# Patient Record
Sex: Male | Born: 1960 | Race: White | Hispanic: No | Marital: Single | Smoking: Former smoker
Health system: Southern US, Community
[De-identification: ages and names within clinical notes are randomized; demographics above are authoritative.]

## PROBLEM LIST (undated history)

## (undated) DIAGNOSIS — E7800 Pure hypercholesterolemia, unspecified: Secondary | ICD-10-CM

## (undated) DIAGNOSIS — F419 Anxiety disorder, unspecified: Secondary | ICD-10-CM

## (undated) DIAGNOSIS — J189 Pneumonia, unspecified organism: Secondary | ICD-10-CM

## (undated) DIAGNOSIS — I10 Essential (primary) hypertension: Secondary | ICD-10-CM

## (undated) DIAGNOSIS — R7303 Prediabetes: Secondary | ICD-10-CM

## (undated) HISTORY — DX: Pneumonia, unspecified organism: J18.9

## (undated) HISTORY — PX: OTHER SURGICAL HISTORY: SHX169

## (undated) HISTORY — DX: Anxiety disorder, unspecified: F41.9

## (undated) HISTORY — DX: Pure hypercholesterolemia, unspecified: E78.00

## (undated) HISTORY — DX: Essential (primary) hypertension: I10

## (undated) HISTORY — DX: Prediabetes: R73.03

---

## 2005-01-17 ENCOUNTER — Emergency Department (HOSPITAL_COMMUNITY): Admission: EM | Admit: 2005-01-17 | Discharge: 2005-01-17 | Payer: Self-pay | Admitting: Orthopedic Surgery

## 2009-01-03 ENCOUNTER — Ambulatory Visit (HOSPITAL_COMMUNITY): Admission: RE | Admit: 2009-01-03 | Discharge: 2009-01-03 | Payer: Self-pay | Admitting: Family Medicine

## 2009-04-08 ENCOUNTER — Ambulatory Visit: Payer: Self-pay | Admitting: Family Medicine

## 2009-04-08 ENCOUNTER — Observation Stay (HOSPITAL_COMMUNITY): Admission: EM | Admit: 2009-04-08 | Discharge: 2009-04-09 | Payer: Self-pay | Admitting: Emergency Medicine

## 2010-05-02 ENCOUNTER — Encounter (INDEPENDENT_AMBULATORY_CARE_PROVIDER_SITE_OTHER): Payer: Self-pay | Admitting: Internal Medicine

## 2010-05-02 ENCOUNTER — Inpatient Hospital Stay (HOSPITAL_COMMUNITY): Admission: EM | Admit: 2010-05-02 | Discharge: 2010-05-03 | Payer: Self-pay | Admitting: Emergency Medicine

## 2010-05-02 ENCOUNTER — Ambulatory Visit: Payer: Self-pay | Admitting: Cardiovascular Disease

## 2010-05-05 ENCOUNTER — Emergency Department (HOSPITAL_COMMUNITY): Admission: EM | Admit: 2010-05-05 | Discharge: 2010-05-05 | Payer: Self-pay | Admitting: Emergency Medicine

## 2010-10-20 LAB — POCT I-STAT, CHEM 8
Sodium: 139 mEq/L (ref 135–145)
TCO2: 28 mmol/L (ref 0–100)

## 2010-10-20 LAB — TROPONIN I: Troponin I: 0.03 ng/mL (ref 0.00–0.06)

## 2010-10-20 LAB — DIFFERENTIAL
Basophils Relative: 1 % (ref 0–1)
Eosinophils Relative: 1 % (ref 0–5)
Lymphs Abs: 2.4 10*3/uL (ref 0.7–4.0)
Monocytes Absolute: 0.8 10*3/uL (ref 0.1–1.0)
Monocytes Relative: 9 % (ref 3–12)
Neutro Abs: 5.3 10*3/uL (ref 1.7–7.7)
Neutrophils Relative %: 62 % (ref 43–77)

## 2010-10-20 LAB — HEPATIC FUNCTION PANEL
Albumin: 3.9 g/dL (ref 3.5–5.2)
Alkaline Phosphatase: 63 U/L (ref 39–117)
Total Bilirubin: 0.4 mg/dL (ref 0.3–1.2)
Total Protein: 6.7 g/dL (ref 6.0–8.3)

## 2010-10-20 LAB — LIPID PANEL
HDL: 44 mg/dL (ref >39)
LDL Cholesterol: 125 mg/dL — ABNORMAL HIGH (ref 0–99)
Total CHOL/HDL Ratio: 4.3 RATIO
Triglycerides: 101 mg/dL (ref <150)
VLDL: 20 mg/dL (ref 0–40)

## 2010-10-20 LAB — CARDIAC PANEL(CRET KIN+CKTOT+MB+TROPI)
CK, MB: 1.3 ng/mL (ref 0.3–4.0)
CK, MB: 1.7 ng/mL (ref 0.3–4.0)
Total CK: 58 U/L (ref 7–232)
Total CK: 64 U/L (ref 7–232)
Troponin I: 0.01 ng/mL (ref 0.00–0.06)
Troponin I: 0.01 ng/mL (ref 0.00–0.06)

## 2010-10-20 LAB — CBC
HCT: 43.9 % (ref 39.0–52.0)
MCH: 32.8 pg (ref 26.0–34.0)
MCHC: 34.6 g/dL (ref 30.0–36.0)
RDW: 13.7 % (ref 11.5–15.5)

## 2010-10-20 LAB — D-DIMER, QUANTITATIVE

## 2010-10-20 LAB — POCT CARDIAC MARKERS
CKMB, poc: <1 ng/mL — ABNORMAL LOW (ref 1.0–8.0)
Myoglobin, poc: 31.6 ng/mL (ref 12–200)
Myoglobin, poc: 38.7 ng/mL (ref 12–200)
Troponin i, poc: <0.05 ng/mL (ref 0.00–0.09)
Troponin i, poc: <0.05 ng/mL (ref 0.00–0.09)

## 2010-10-20 LAB — BASIC METABOLIC PANEL
Creatinine, Ser: 0.85 mg/dL (ref 0.4–1.5)
GFR calc non Af Amer: >60 mL/min (ref >60)
Glucose, Bld: 96 mg/dL (ref 70–99)
Potassium: 4.3 mEq/L (ref 3.5–5.1)
Sodium: 137 mEq/L (ref 135–145)

## 2010-10-20 LAB — HEMOGLOBIN A1C: Hgb A1c MFr Bld: 6.1 % — ABNORMAL HIGH (ref <5.7)

## 2010-10-20 LAB — CK TOTAL AND CKMB (NOT AT ARMC)
CK, MB: 1.7 ng/mL (ref 0.3–4.0)
Relative Index: INVALID (ref 0.0–2.5)

## 2010-10-20 LAB — MRSA PCR SCREENING: MRSA by PCR: NEGATIVE

## 2010-10-20 LAB — LIPASE, BLOOD: Lipase: 41 U/L (ref 11–59)

## 2010-10-20 LAB — TSH: TSH: 54.362 u[IU]/mL — ABNORMAL HIGH (ref 0.350–4.500)

## 2010-11-11 LAB — CBC
HCT: 45.5 % (ref 39.0–52.0)
HCT: 48.7 % (ref 39.0–52.0)
MCV: 99.1 fL (ref 78.0–100.0)
Platelets: 239 10*3/uL (ref 150–400)
Platelets: 241 10*3/uL (ref 150–400)
RDW: 13.6 % (ref 11.5–15.5)
RDW: 13.6 % (ref 11.5–15.5)
WBC: 8.1 10*3/uL (ref 4.0–10.5)

## 2010-11-11 LAB — CK TOTAL AND CKMB (NOT AT ARMC)
CK, MB: 1.5 ng/mL (ref 0.3–4.0)
Relative Index: INVALID (ref 0.0–2.5)

## 2010-11-11 LAB — BASIC METABOLIC PANEL
BUN: 9 mg/dL (ref 6–23)
Calcium: 9.2 mg/dL (ref 8.4–10.5)
Creatinine, Ser: 1.1 mg/dL (ref 0.4–1.5)
GFR calc non Af Amer: >60 mL/min (ref >60)

## 2010-11-11 LAB — POCT CARDIAC MARKERS: Myoglobin, poc: 53.7 ng/mL (ref 12–200)

## 2010-11-11 LAB — CARDIAC PANEL(CRET KIN+CKTOT+MB+TROPI)
Relative Index: INVALID (ref 0.0–2.5)
Troponin I: 0.01 ng/mL (ref 0.00–0.06)
Troponin I: 0.01 ng/mL (ref 0.00–0.06)

## 2010-11-11 LAB — LIPID PANEL
HDL: 40 mg/dL (ref >39)
Total CHOL/HDL Ratio: 4.5 RATIO
VLDL: 29 mg/dL (ref 0–40)

## 2010-11-11 LAB — COMPREHENSIVE METABOLIC PANEL
Albumin: 4.5 g/dL (ref 3.5–5.2)
BUN: 7 mg/dL (ref 6–23)
CO2: 26 mEq/L (ref 19–32)
Calcium: 9.5 mg/dL (ref 8.4–10.5)
Chloride: 107 mEq/L (ref 96–112)
Creatinine, Ser: 0.92 mg/dL (ref 0.4–1.5)
GFR calc non Af Amer: >60 mL/min (ref >60)
Total Bilirubin: 0.8 mg/dL (ref 0.3–1.2)

## 2010-11-11 LAB — TROPONIN I: Troponin I: 0.02 ng/mL (ref 0.00–0.06)

## 2011-12-01 ENCOUNTER — Encounter: Payer: Self-pay | Admitting: *Deleted

## 2011-12-24 ENCOUNTER — Emergency Department (HOSPITAL_COMMUNITY)
Admission: EM | Admit: 2011-12-24 | Discharge: 2011-12-24 | Disposition: A | Discharge status: Home / Self Care | Payer: BC Managed Care – PPO | Attending: Emergency Medicine | Admitting: Emergency Medicine

## 2011-12-24 ENCOUNTER — Emergency Department (HOSPITAL_COMMUNITY): Payer: BC Managed Care – PPO

## 2011-12-24 ENCOUNTER — Encounter (HOSPITAL_COMMUNITY): Payer: Self-pay | Admitting: *Deleted

## 2011-12-24 DIAGNOSIS — R079 Chest pain, unspecified: Secondary | ICD-10-CM | POA: Insufficient documentation

## 2011-12-24 DIAGNOSIS — R05 Cough: Secondary | ICD-10-CM | POA: Insufficient documentation

## 2011-12-24 DIAGNOSIS — R059 Cough, unspecified: Secondary | ICD-10-CM | POA: Insufficient documentation

## 2011-12-24 DIAGNOSIS — R0982 Postnasal drip: Secondary | ICD-10-CM | POA: Insufficient documentation

## 2011-12-24 DIAGNOSIS — R011 Cardiac murmur, unspecified: Secondary | ICD-10-CM | POA: Insufficient documentation

## 2011-12-24 DIAGNOSIS — I10 Essential (primary) hypertension: Secondary | ICD-10-CM | POA: Insufficient documentation

## 2011-12-24 DIAGNOSIS — F17200 Nicotine dependence, unspecified, uncomplicated: Secondary | ICD-10-CM | POA: Insufficient documentation

## 2011-12-24 DIAGNOSIS — J3489 Other specified disorders of nose and nasal sinuses: Secondary | ICD-10-CM | POA: Insufficient documentation

## 2011-12-24 DIAGNOSIS — K209 Esophagitis, unspecified without bleeding: Secondary | ICD-10-CM | POA: Insufficient documentation

## 2011-12-24 DIAGNOSIS — Z79899 Other long term (current) drug therapy: Secondary | ICD-10-CM | POA: Insufficient documentation

## 2011-12-24 DIAGNOSIS — R0602 Shortness of breath: Secondary | ICD-10-CM | POA: Insufficient documentation

## 2011-12-24 DIAGNOSIS — R053 Chronic cough: Secondary | ICD-10-CM

## 2011-12-24 DIAGNOSIS — R209 Unspecified disturbances of skin sensation: Secondary | ICD-10-CM | POA: Insufficient documentation

## 2011-12-24 DIAGNOSIS — R509 Fever, unspecified: Secondary | ICD-10-CM | POA: Insufficient documentation

## 2011-12-24 LAB — PRO B NATRIURETIC PEPTIDE: Pro B Natriuretic peptide (BNP): 65.3 pg/mL (ref 0–125)

## 2011-12-24 LAB — COMPREHENSIVE METABOLIC PANEL
ALT: 30 U/L (ref 0–53)
BUN: 13 mg/dL (ref 6–23)
Calcium: 9.2 mg/dL (ref 8.4–10.5)
GFR calc Af Amer: >90 mL/min (ref >90)
Glucose, Bld: 247 mg/dL — ABNORMAL HIGH (ref 70–99)
Sodium: 134 mEq/L — ABNORMAL LOW (ref 135–145)
Total Protein: 7.2 g/dL (ref 6.0–8.3)

## 2011-12-24 LAB — CBC
HCT: 44.5 % (ref 39.0–52.0)
Hemoglobin: 15.4 g/dL (ref 13.0–17.0)
MCH: 33 pg (ref 26.0–34.0)
MCHC: 34.6 g/dL (ref 30.0–36.0)
MCV: 95.3 fL (ref 78.0–100.0)
Platelets: 281 10*3/uL (ref 150–400)
RBC: 4.67 MIL/uL (ref 4.22–5.81)
RDW: 13.9 % (ref 11.5–15.5)
WBC: 19.1 K/uL — ABNORMAL HIGH (ref 4.0–10.5)

## 2011-12-24 LAB — DIFFERENTIAL
Basophils Absolute: 0 K/uL (ref 0.0–0.1)
Basophils Relative: 0 % (ref 0–1)
Eosinophils Absolute: 0 10*3/uL (ref 0.0–0.7)
Eosinophils Relative: 0 % (ref 0–5)
Lymphocytes Relative: 6 % — ABNORMAL LOW (ref 12–46)
Lymphs Abs: 1.1 10*3/uL (ref 0.7–4.0)
Monocytes Absolute: 0.9 K/uL (ref 0.1–1.0)
Monocytes Relative: 5 % (ref 3–12)
Neutro Abs: 17.2 K/uL — ABNORMAL HIGH (ref 1.7–7.7)
Neutrophils Relative %: 90 % — ABNORMAL HIGH (ref 43–77)

## 2011-12-24 LAB — COMPREHENSIVE METABOLIC PANEL WITH GFR
AST: 22 U/L (ref 0–37)
Albumin: 3.8 g/dL (ref 3.5–5.2)
Alkaline Phosphatase: 66 U/L (ref 39–117)
CO2: 19 meq/L (ref 19–32)
Chloride: 97 meq/L (ref 96–112)
Creatinine, Ser: 0.79 mg/dL (ref 0.50–1.35)
GFR calc non Af Amer: >90 mL/min (ref >90)
Potassium: 4.3 meq/L (ref 3.5–5.1)
Total Bilirubin: 0.2 mg/dL — ABNORMAL LOW (ref 0.3–1.2)

## 2011-12-24 LAB — LIPASE, BLOOD: Lipase: 29 U/L (ref 11–59)

## 2011-12-24 LAB — D-DIMER, QUANTITATIVE: D-Dimer, Quant: <0.22 ug{FEU}/mL (ref 0.00–0.48)

## 2011-12-24 LAB — TROPONIN I: Troponin I: <0.3 ng/mL (ref <0.30)

## 2011-12-24 MED ORDER — ALBUTEROL SULFATE (5 MG/ML) 0.5% IN NEBU
5.0000 mg | INHALATION_SOLUTION | Freq: Once | RESPIRATORY_TRACT | Status: AC
  Administered 2011-12-24: 5 mg via RESPIRATORY_TRACT
  Filled 2011-12-24: qty 1

## 2011-12-24 MED ORDER — GI COCKTAIL ~~LOC~~
30.0000 mL | Freq: Once | ORAL | Status: AC
  Administered 2011-12-24: 30 mL via ORAL
  Filled 2011-12-24: qty 30

## 2011-12-24 MED ORDER — METHYLPREDNISOLONE SODIUM SUCC 125 MG IJ SOLR
125.0000 mg | Freq: Once | INTRAMUSCULAR | Status: AC
  Administered 2011-12-24: 125 mg via INTRAVENOUS
  Filled 2011-12-24: qty 2

## 2011-12-24 MED ORDER — IPRATROPIUM BROMIDE 0.02 % IN SOLN
0.5000 mg | Freq: Once | RESPIRATORY_TRACT | Status: AC
  Administered 2011-12-24: 0.5 mg via RESPIRATORY_TRACT
  Filled 2011-12-24: qty 2.5

## 2011-12-24 MED ORDER — KETOROLAC TROMETHAMINE 30 MG/ML IJ SOLN
30.0000 mg | Freq: Once | INTRAMUSCULAR | Status: AC
  Administered 2011-12-24: 30 mg via INTRAVENOUS
  Filled 2011-12-24: qty 1

## 2011-12-24 MED ORDER — HYDROCODONE-ACETAMINOPHEN 5-325 MG PO TABS: ORAL_TABLET | ORAL | Status: AC

## 2011-12-24 MED ORDER — IOHEXOL 350 MG/ML SOLN
100.0000 mL | Freq: Once | INTRAVENOUS | Status: AC | PRN
  Administered 2011-12-24: 100 mL via INTRAVENOUS

## 2011-12-24 MED ORDER — LORAZEPAM 2 MG/ML IJ SOLN
1.0000 mg | Freq: Once | INTRAMUSCULAR | Status: AC
  Administered 2011-12-24: 2 mg via INTRAVENOUS
  Filled 2011-12-24: qty 1

## 2011-12-24 NOTE — Discharge Instructions (Signed)
Chest Pain (Nonspecific) It is often hard to give a specific diagnosis for the cause of chest pain. There is always a chance that your pain could be related to something serious, such as a heart attack or a blood clot in the lungs. You need to follow up with your caregiver for further evaluation. CAUSES   Heartburn.   Pneumonia or bronchitis.   Anxiety or stress.   Inflammation around your heart (pericarditis) or lung (pleuritis or pleurisy).   A blood clot in the lung.   A collapsed lung (pneumothorax). It can develop suddenly on its own (spontaneous pneumothorax) or from injury (trauma) to the chest.   Shingles infection (herpes zoster virus).  The chest wall is composed of bones, muscles, and cartilage. Any of these can be the source of the pain.  The bones can be bruised by injury.   The muscles or cartilage can be strained by coughing or overwork.   The cartilage can be affected by inflammation and become sore (costochondritis).  DIAGNOSIS  Lab tests or other studies, such as X-rays, electrocardiography, stress testing, or cardiac imaging, may be needed to find the cause of your pain.  TREATMENT   Treatment depends on what may be causing your chest pain. Treatment may include:   Acid blockers for heartburn.   Anti-inflammatory medicine.   Pain medicine for inflammatory conditions.   Antibiotics if an infection is present.   You may be advised to change lifestyle habits. This includes stopping smoking and avoiding alcohol, caffeine, and chocolate.   You may be advised to keep your head raised (elevated) when sleeping. This reduces the chance of acid going backward from your stomach into your esophagus.   Most of the time, nonspecific chest pain will improve within 2 to 3 days with rest and mild pain medicine.  HOME CARE INSTRUCTIONS   If antibiotics were prescribed, take your antibiotics as directed. Finish them even if you start to feel better.   For the next few  days, avoid physical activities that bring on chest pain. Continue physical activities as directed.   Do not smoke.   Avoid drinking alcohol.   Only take over-the-counter or prescription medicine for pain, discomfort, or fever as directed by your caregiver.   Follow your caregiver's suggestions for further testing if your chest pain does not go away.   Keep any follow-up appointments you made. If you do not go to an appointment, you could develop lasting (chronic) problems with pain. If there is any problem keeping an appointment, you must call to reschedule.  SEEK MEDICAL CARE IF:   You think you are having problems from the medicine you are taking. Read your medicine instructions carefully.   Your chest pain does not go away, even after treatment.   You develop a rash with blisters on your chest.  SEEK IMMEDIATE MEDICAL CARE IF:   You have increased chest pain or pain that spreads to your arm, neck, jaw, back, or abdomen.   You develop shortness of breath, an increasing cough, or you are coughing up blood.   You have severe back or abdominal pain, feel nauseous, or vomit.   You develop severe weakness, fainting, or chills.   You have a fever.  THIS IS AN EMERGENCY. Do not wait to see if the pain will go away. Get medical help at once. Call your local emergency services (911 in [place] Do not drive yourself to the hospital. MAKE SURE YOU:   Understand these instructions.     Will watch your condition.   Will get help right away if you are not doing well or get worse.  Document Released: 05/03/2005 Document Revised: 07/13/2011 Document Reviewed: 02/27/2008 [employer] Patient Information 2012 [employer]. 

## 2011-12-24 NOTE — ED Notes (Signed)
Patient  States he has been seeing a Radiation protection practitioner week since Nov. Has been treated  For sinus problems and pneumonia, c/o increased pain when walking states he gets sob. States he was seen by his Md on Thurs and given more medicines that aren't working.

## 2011-12-24 NOTE — ED Provider Notes (Signed)
History     CSN: [id number]  Arrival date & time 12/24/11  0620   First MD Initiated Contact with Patient 12/24/11 0715      Chief Complaint  Patient presents with  . Chest Pain    (Consider location/radiation/quality/duration/timing/severity/associated sxs/prior treatment) HPI Comments: Pt reports has had cough, breathing issues and CP described as constant "pain" in middle sternum, upper epigastric area with associated tingling in both upper arms, dry cough since around November.  Pt is a cigarette smoker, although trying to cut back, using an electric cigarrette now.  He used to work in asbestos environment for about 4 years, less than 5 years ago.  No h/o COPD or asthma.  Has been treated for bronchitis multiple times, then diagnosed with pneumonia by PCP in [place]  In Feb.  Got better for about 3 days upon completion of abx, but then symptoms recurred.  Last week, had fever and sweats and worsening CP so went to see PCP again 4 days ago and got put on multiple abx, steroids, cough meds, allergy meds.  Pt endorses some rhinorrhea, cough is dry, worse in AM, CP is constant however and worse with exertion and slightly worse with coughing.  Pt reports since not any better since being on this round of meds, came in to be seen.    Patient is a 51 y.o. male presenting with chest pain. The history is provided by the patient.  Chest Pain The chest pain began more  than 1 month ago. Chest pain occurs constantly. The pain is associated with coughing and exertion. At its most intense, the pain is at 9/10. Primary symptoms include a fever, shortness of breath and cough. Pertinent negatives for primary symptoms include no abdominal pain, no nausea, no vomiting and no dizziness.     Past Medical History  Diagnosis Date  . Hypertension     Past Surgical History  Procedure Date  . Stitches to left hand     Family History  Problem Relation Age of Onset  . Kidney cancer Father   . Heart  disease Father     History  Substance Use Topics  . Smoking status: Current Everyday Smoker -- 0.5 packs/day  . Smokeless tobacco: Not on file  . Alcohol Use: 0.0 oz/week    1-6 Cans of beer per week      Review of Systems  Constitutional: Positive for fever and chills.  HENT: Positive for congestion, rhinorrhea and postnasal drip. Negative for sore throat.   Eyes: Negative for pain, discharge and itching.  Respiratory: Positive for cough and shortness of breath.   Cardiovascular: Positive for chest pain.  Gastrointestinal: Negative for nausea, vomiting, abdominal pain, diarrhea and blood in stool.  Musculoskeletal: Negative for back pain.  Skin: Negative for color change and rash.  Neurological: Negative for dizziness, syncope and headaches.  All other systems reviewed and are negative.    Allergies  Review of patient's allergies indicates no known allergies.  Home Medications   Current Outpatient Rx  Name Route Sig Dispense Refill  . ACETAMINOPHEN 500 MG PO TABS Oral Take 1,000 mg by mouth every 6 (six) hours as needed. For pain or fever    . ALBUTEROL SULFATE HFA 108 (90 BASE) MCG/ACT IN AERS Inhalation Inhale 2 puffs into the lungs every 6 (six) hours as needed. Or shortness of breath and wheezing    . ALPRAZOLAM 0.25 MG PO TABS Oral Take 0.25-50 mg by mouth daily as needed. For anxiety    .  BUDESONIDE-FORMOTEROL FUMARATE 160-4.5 MCG/ACT IN AERO Inhalation Inhale 2 puffs into the lungs 2 (two) times daily.    Marland Kitchen CLARITHROMYCIN 500 MG PO TABS Oral Take 500 mg by mouth 2 (two) times daily.    Marland Kitchen HYDROCODONE-ACETAMINOPHEN 5-500 MG PO TABS Oral Take 1 tablet by mouth every 6 (six) hours as needed. For pain    . LEVOCETIRIZINE DIHYDROCHLORIDE 5 MG PO TABS Oral Take 5 mg by mouth daily.    Marland Kitchen MONTELUKAST SODIUM 10 MG PO TABS Oral Take 10 mg by mouth at bedtime.    . OMEPRAZOLE 20 MG PO CPDR Oral Take 20 mg by mouth daily as needed. For heart burn    . PREDNISONE 10 MG PO TABS  Oral Take 20-60 mg by mouth daily. 12 day taper 6 tabs for 4 days 4 tabs for 4 days 2 tabs for 4 days Has only taken for 2 days (started Friday)    . VALSARTAN-HYDROCHLOROTHIAZIDE 320-12.5 MG PO TABS Oral Take 1 tablet by mouth daily.    Marland Kitchen HYDROCODONE-ACETAMINOPHEN 5-325 MG PO TABS  1-2 tablets po q 6 hours prn moderate to severe pain 20 tablet 0    BP 114/60  Pulse 83  Temp(Src) 97.9 F (36.6 C) (Oral)  Resp 18  SpO2 93%  Physical Exam  Nursing note and vitals reviewed. Constitutional: He is oriented to person, place, and time. He appears well-developed and well-nourished. No distress.  HENT:  Head: Normocephalic and atraumatic.  Nose: Nose normal.  Mouth/Throat: Uvula is midline and oropharynx is clear and moist.  Eyes: Pupils are equal, round, and reactive to light.  Neck: Normal range of motion. Neck supple.  Cardiovascular: Normal rate.   Murmur heard. Pulmonary/Chest: Effort normal. No respiratory distress.  Abdominal: Soft. He exhibits no distension. There is no tenderness. There is no rebound.  Musculoskeletal: He exhibits no edema and no tenderness.  Neurological: He is alert and oriented to person, place, and time.  Skin: Skin is warm and dry. No rash noted. He is not diaphoretic.  Psychiatric: He has a normal mood and affect.    ED Course  Procedures (including critical care time)  Labs Reviewed  CBC - Abnormal; Notable for the following:    WBC 19.1 (*)    All other components within normal limits  DIFFERENTIAL - Abnormal; Notable for the following:    Neutrophils Relative 90 (*)    Neutro Abs 17.2 (*)    Lymphocytes Relative 6 (*)    All other components within normal limits  COMPREHENSIVE METABOLIC PANEL - Abnormal; Notable for the following:    Sodium 134 (*)    Glucose, Bld 247 (*)    Total Bilirubin 0.2 (*)    All other components within normal limits  LIPASE, BLOOD  TROPONIN I  PRO B NATRIURETIC PEPTIDE  D-DIMER, QUANTITATIVE   Dg Chest 2  View  12/24/2011  *RADIOLOGY REPORT*  Clinical Data: Shortness of breath, cough, chest pain  CHEST - 2 VIEW  Comparison: 05/02/2010; 04/16/2009  Findings:  Grossly unchanged cardiac silhouette and mediastinal contours. There is mild diffuse increased conspicuity of the pulmonary interstitium without focal airspace opacity.  No pleural effusion or pneumothorax.  Grossly unchanged bones.  IMPRESSION: Overall findings suggestive of airways disease/bronchitis, though note, atypical infection may have a similar appearance.  No focal airspace opacities to suggest bronchopneumonia.  Original Report Authenticated By: Waynard Reeds, M.D.   Ct Angio Chest W/cm &/or Wo Cm  12/24/2011  *RADIOLOGY REPORT*  Clinical  Data: Chest pain.Shortness of breath.  CT ANGIOGRAPHY CHEST  Technique:  Multidetector CT imaging of the chest using the standard protocol during bolus administration of intravenous contrast. Multiplanar reconstructed images including MIPs were obtained and reviewed to evaluate the vascular anatomy.  Contrast: OMNIPAQUE IOHEXOL 350 MG/ML SOLN  Comparison: No priors.  Findings:  Mediastinum: There are no filling defects within the pulmonary arterial tree to suggest underlying pulmonary embolism. No acute abnormality of the thoracic aorta; specifically, no aneurysm or dissection. Heart size is normal. There is no significant pericardial fluid, thickening or pericardial calcification. There is atherosclerosis of the thoracic aorta, the great vessels of the mediastinum and the coronary arteries, including calcified atherosclerotic plaque in the left main and left anterior descending coronary arteries. Calcifications of the aortic valve. Mild thickening of the distal third of the esophagus.  Lungs/Pleura: Dependent atelectasis is noted in the lower lobes of the lungs bilaterally.  No consolidative airspace disease.  No pleural effusions.  No definite suspicious appearing pulmonary nodules or masses are  identified.  Upper Abdomen: Unremarkable.  Musculoskeletal: There are no aggressive appearing lytic or blastic lesions noted in the visualized portions of the skeleton.  IMPRESSION: 1.  No evidence of pulmonary embolism. 2.  There is mild thickening of the distal third of the esophagus. This is nonspecific, but could suggest sequelae of reflux esophagitis. 3. Atherosclerosis, including left main and LAD coronary artery disease. Please note that although the presence of coronary artery calcium documents the presence of coronary artery disease, the severity of this disease and any potential stenosis cannot be assessed on this non-gated CT examination.  Assessment for potential risk factor modification, dietary therapy or pharmacologic therapy may be warranted, if clinically indicated. 4. There are calcifications of the aortic valve.  Echocardiographic correlation for evaluation of potential valvular dysfunction may be warranted if clinically indicated.  Original Report Authenticated By: Florencia Reasons, M.D.     1. Esophagitis   2. Chronic cough   3. Chest pain     RA sat is 95% and low normal ECG at time 06:27 shows NSR at rate 86, normal axis, normal interval, non specific T wave abn..  No sig change from ECG on 04/09/09.   9:04 AM After neb, pt is more anxious.  Already on xanax.  Will give ativan.  Pt reports not feeling much improved.     12:30 PM Pt's CT reviewed by me.  No pneumonia.  Some CAD noted, but not a coronary CT scan.  Will refer to cardiology.  Distal third esophagitis which may be more indicative of his symptoms.  Certain GERD can cause a chronic cough as well.  Likely needs GI as well.     12:53 PM Discussed with Dr. Daleen Squibb who will let Dr. Eden Emms know and get him an appt to be seen in office soon.  MDM  Atypical constant CP for months, associated with coughing.  Pt with some cardiac risks, but in the setting of fever, chills, coughing.  Plan to obtain troponin, ddimer.   Otherwise treat with nebs and IV Steroids for now.     Of note, pt had a nuclear myocardia scan in Sept 2011 that was normal after admission for atypical CP, seen by Dr. Eden Emms.         Gavin Pound. Luiz Trumpower, MD 12/24/11 1253

## 2011-12-24 NOTE — ED Notes (Signed)
Patient transported to X-ray 

## 2014-01-17 IMAGING — CT CT ANGIO CHEST
2 of 6 series · 18 of 46 positions shown · IV contrast (APPLIED)
Comparison: No priors.

CLINICAL DATA: Chest pain.Shortness of breath.

CT ANGIOGRAPHY CHEST
TECHNIQUE: Multidetector CT imaging of the chest using the
standard protocol during bolus administration of intravenous
contrast. Multiplanar reconstructed images including MIPs were
obtained and reviewed to evaluate the vascular anatomy.
Contrast: 100mL OMNIPAQUE IOHEXOL 350 MG/ML SOLN

[Series 6: pulm embolism 1.0 b25f thin · axial · 0.63mm/px · z∈[-272,-58]mm · 15 of 235 slices shown]
[im 11/235  lung]
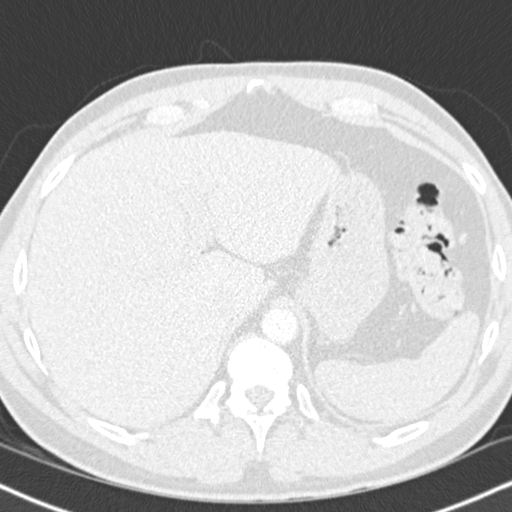
[im 31/235  soft-tissue]
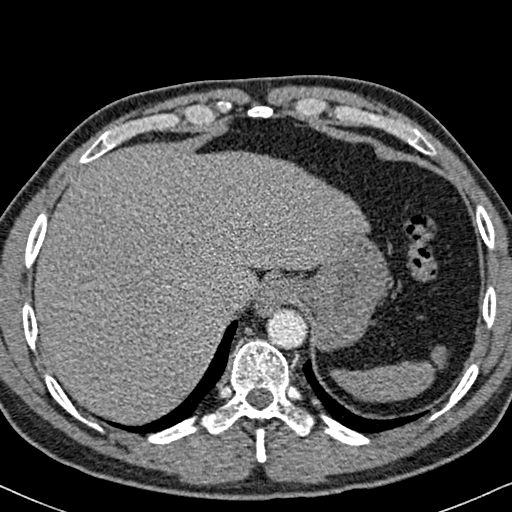
[im 41/235  lung]
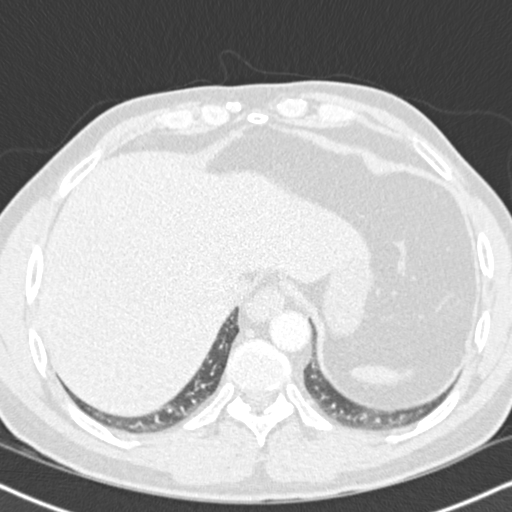
[im 62/235  soft-tissue]
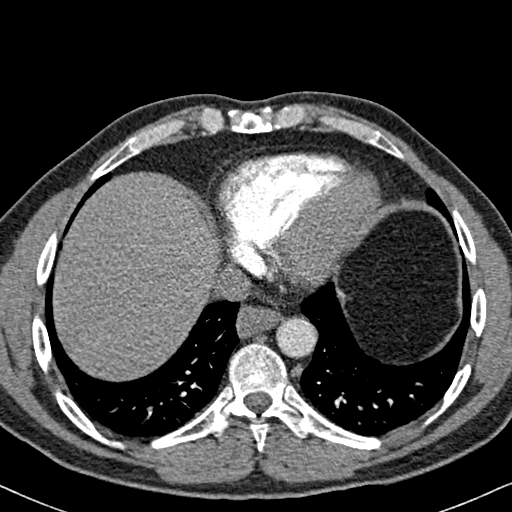
[im 72/235  lung]
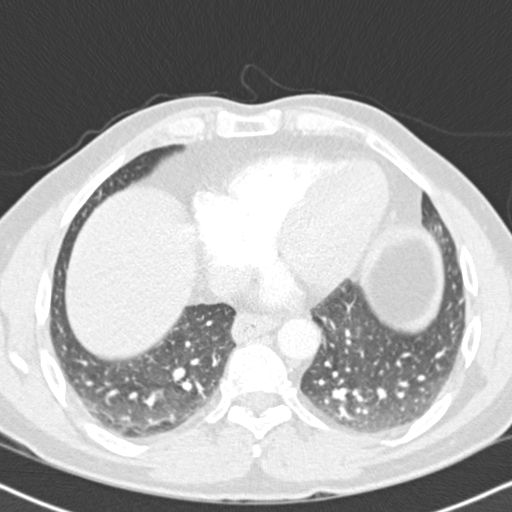
[im 92/235  soft-tissue]
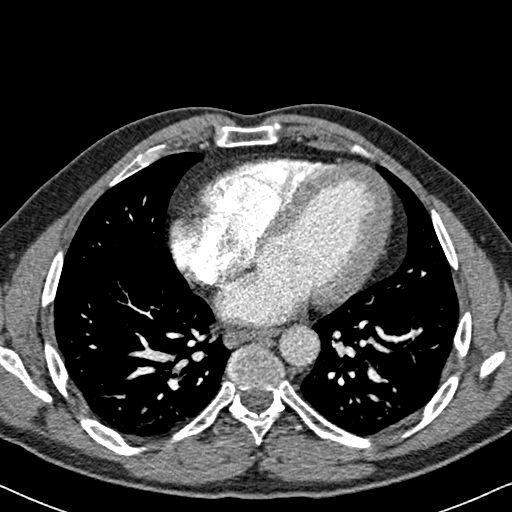
[im 102/235  lung]
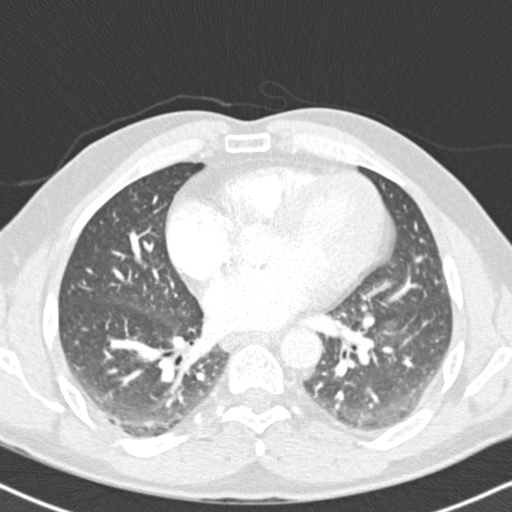
[im 123/235  soft-tissue]
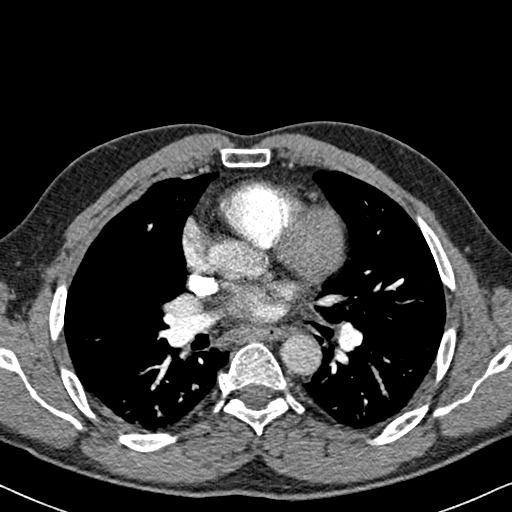
[im 133/235  lung]
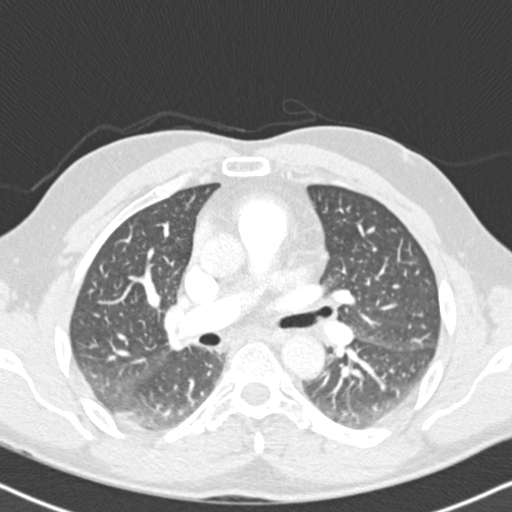
[im 143/235  soft-tissue]
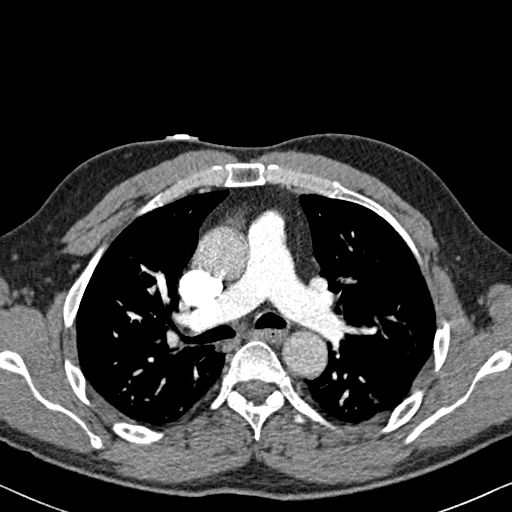
[im 163/235  lung]
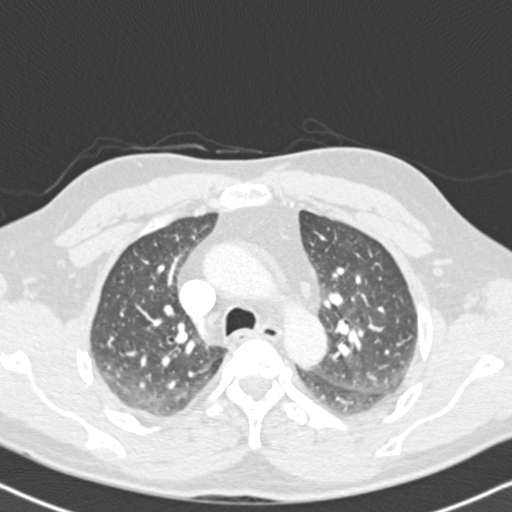
[im 173/235  soft-tissue]
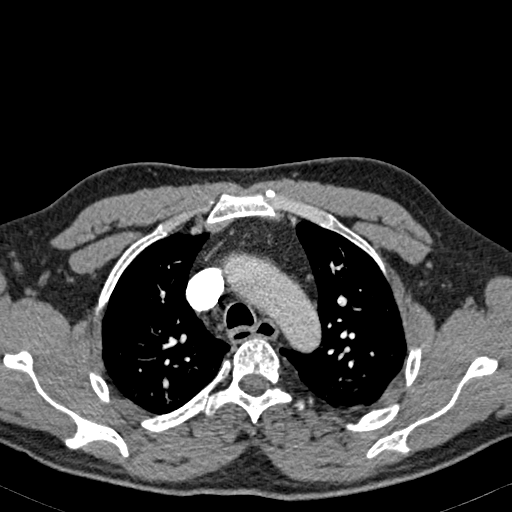
[im 194/235  lung]
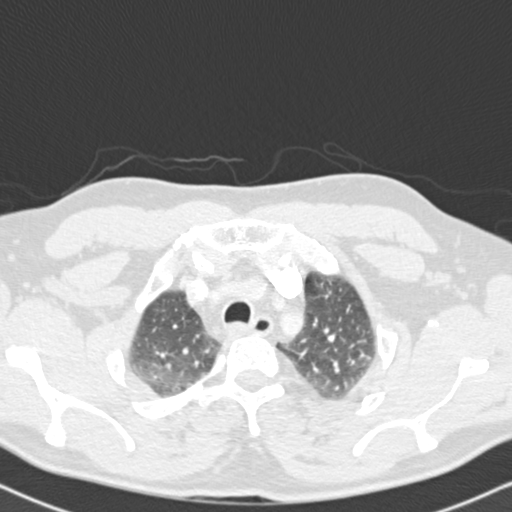
[im 204/235  soft-tissue]
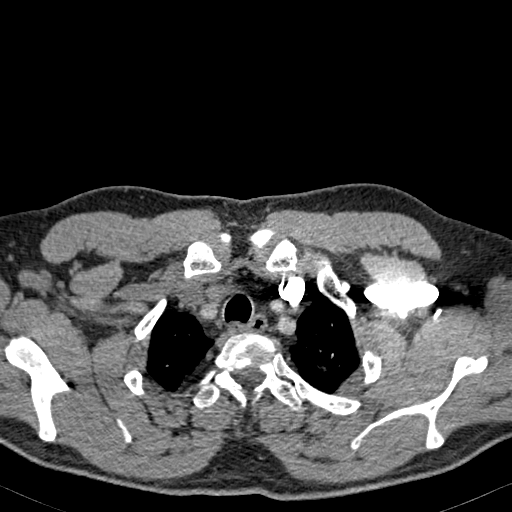
[im 224/235  lung]
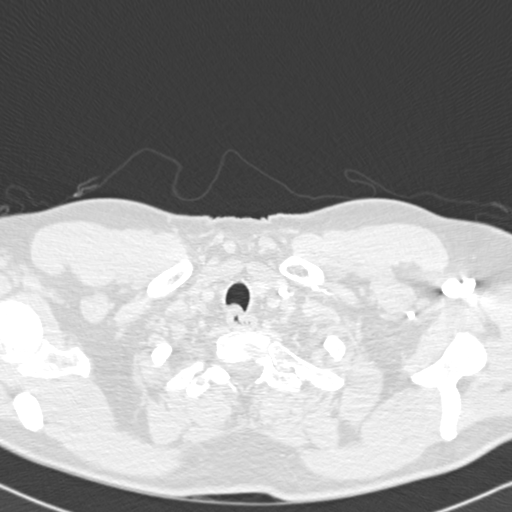

[Series 602: coronal mpr · coronal · 0.63mm/px · 3 of 115 slices shown]
[im 29/115  soft-tissue]
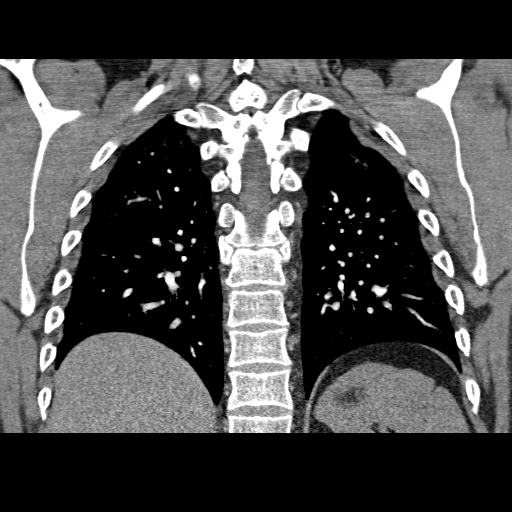
[im 58/115  soft-tissue]
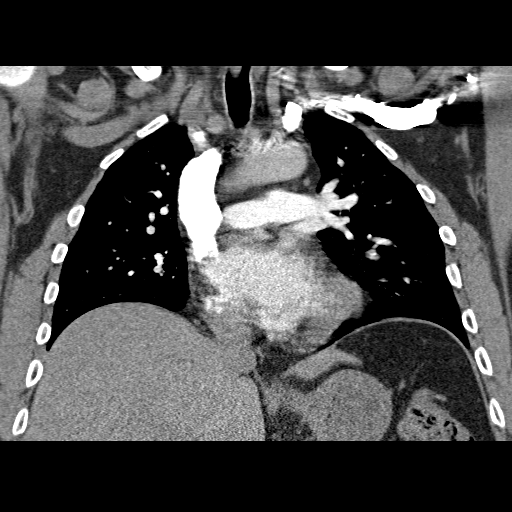
[im 86/115  soft-tissue]
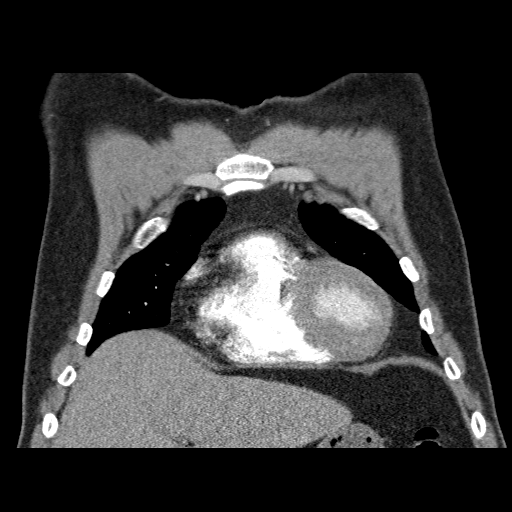

[18 of 46 positions shown; findings below may reference images not displayed]

FINDINGS: Mediastinum: There are no filling defects within the pulmonary
arterial tree to suggest underlying pulmonary embolism. No acute
abnormality of the thoracic aorta; specifically, no aneurysm or
dissection. Heart size is normal. There is no significant
pericardial fluid, thickening or pericardial calcification. There
is atherosclerosis of the thoracic aorta, the great vessels of the
mediastinum and the coronary arteries, including calcified
atherosclerotic plaque in the left main and left anterior
descending coronary arteries. Calcifications of the aortic valve.
Mild thickening of the distal third of the esophagus.

Lungs/Pleura: Dependent atelectasis is noted in the lower lobes of
the lungs bilaterally.  No consolidative airspace disease.  No
pleural effusions.  No definite suspicious appearing pulmonary
nodules or masses are identified.

Upper Abdomen: Unremarkable.

Musculoskeletal: There are no aggressive appearing lytic or blastic
lesions noted in the visualized portions of the skeleton.
IMPRESSION: 1.  No evidence of pulmonary embolism.
2.  There is mild thickening of the distal third of the esophagus.
This is nonspecific, but could suggest sequelae of reflux
esophagitis.
3. Atherosclerosis, including left main and LAD coronary artery
disease. Please note that although the presence of coronary artery
calcium documents the presence of coronary artery disease, the
severity of this disease and any potential stenosis cannot be
assessed on this non-gated CT examination.  Assessment for
potential risk factor modification, dietary therapy or
pharmacologic therapy may be warranted, if clinically indicated.
4. There are calcifications of the aortic valve.  Echocardiographic
correlation for evaluation of potential valvular dysfunction may be
warranted if clinically indicated.

## 2016-07-05 DIAGNOSIS — I10 Essential (primary) hypertension: Secondary | ICD-10-CM

## 2016-07-05 DIAGNOSIS — I2510 Atherosclerotic heart disease of native coronary artery without angina pectoris: Secondary | ICD-10-CM | POA: Insufficient documentation

## 2016-07-05 DIAGNOSIS — E785 Hyperlipidemia, unspecified: Secondary | ICD-10-CM

## 2016-07-05 HISTORY — DX: Essential (primary) hypertension: I10

## 2016-07-05 HISTORY — DX: Atherosclerotic heart disease of native coronary artery without angina pectoris: I25.10

## 2016-07-05 HISTORY — DX: Hyperlipidemia, unspecified: E78.5

## 2017-03-20 DIAGNOSIS — M25571 Pain in right ankle and joints of right foot: Secondary | ICD-10-CM

## 2017-03-20 DIAGNOSIS — M25572 Pain in left ankle and joints of left foot: Secondary | ICD-10-CM | POA: Insufficient documentation

## 2017-03-20 HISTORY — DX: Pain in right ankle and joints of right foot: M25.571

## 2017-05-30 DIAGNOSIS — Z7189 Other specified counseling: Secondary | ICD-10-CM | POA: Insufficient documentation

## 2017-05-30 HISTORY — DX: Other specified counseling: Z71.89

## 2017-07-23 DIAGNOSIS — E039 Hypothyroidism, unspecified: Secondary | ICD-10-CM | POA: Insufficient documentation

## 2017-07-23 DIAGNOSIS — N529 Male erectile dysfunction, unspecified: Secondary | ICD-10-CM | POA: Insufficient documentation

## 2017-07-23 DIAGNOSIS — Z0000 Encounter for general adult medical examination without abnormal findings: Secondary | ICD-10-CM

## 2017-07-23 DIAGNOSIS — E1165 Type 2 diabetes mellitus with hyperglycemia: Secondary | ICD-10-CM

## 2017-07-23 HISTORY — DX: Male erectile dysfunction, unspecified: N52.9

## 2017-07-23 HISTORY — DX: Hypothyroidism, unspecified: E03.9

## 2017-07-23 HISTORY — DX: Encounter for general adult medical examination without abnormal findings: Z00.00

## 2017-07-23 HISTORY — DX: Type 2 diabetes mellitus with hyperglycemia: E11.65

## 2017-10-31 DIAGNOSIS — M79671 Pain in right foot: Secondary | ICD-10-CM

## 2017-10-31 DIAGNOSIS — M79672 Pain in left foot: Secondary | ICD-10-CM

## 2017-10-31 HISTORY — DX: Pain in right foot: M79.671

## 2017-10-31 HISTORY — DX: Pain in left foot: M79.672

## 2018-03-08 DIAGNOSIS — K219 Gastro-esophageal reflux disease without esophagitis: Secondary | ICD-10-CM

## 2018-03-08 DIAGNOSIS — F5104 Psychophysiologic insomnia: Secondary | ICD-10-CM | POA: Insufficient documentation

## 2018-03-08 DIAGNOSIS — M25561 Pain in right knee: Secondary | ICD-10-CM | POA: Insufficient documentation

## 2018-03-08 DIAGNOSIS — M545 Low back pain, unspecified: Secondary | ICD-10-CM | POA: Insufficient documentation

## 2018-03-08 HISTORY — DX: Low back pain, unspecified: M54.50

## 2018-03-08 HISTORY — DX: Pain in right knee: M25.561

## 2018-03-08 HISTORY — DX: Gastro-esophageal reflux disease without esophagitis: K21.9

## 2018-03-08 HISTORY — DX: Psychophysiologic insomnia: F51.04

## 2018-10-17 DIAGNOSIS — Z720 Tobacco use: Secondary | ICD-10-CM | POA: Insufficient documentation

## 2018-10-17 DIAGNOSIS — F5101 Primary insomnia: Secondary | ICD-10-CM

## 2018-10-17 HISTORY — DX: Tobacco use: Z72.0

## 2018-10-17 HISTORY — DX: Primary insomnia: F51.01

## 2019-04-30 ENCOUNTER — Encounter: Payer: Self-pay | Admitting: Gastroenterology

## 2019-06-02 ENCOUNTER — Encounter: Payer: Self-pay | Admitting: Gastroenterology

## 2019-06-02 ENCOUNTER — Ambulatory Visit: Payer: Self-pay | Admitting: Nurse Practitioner

## 2019-06-11 ENCOUNTER — Other Ambulatory Visit: Payer: Self-pay

## 2019-06-11 ENCOUNTER — Encounter: Payer: Self-pay | Admitting: *Deleted

## 2019-06-16 ENCOUNTER — Encounter: Payer: Self-pay | Admitting: Gastroenterology

## 2019-06-20 ENCOUNTER — Encounter: Payer: Self-pay | Admitting: Nurse Practitioner

## 2019-06-20 ENCOUNTER — Other Ambulatory Visit: Payer: Self-pay

## 2019-06-20 ENCOUNTER — Telehealth: Payer: Self-pay

## 2019-06-20 ENCOUNTER — Ambulatory Visit (INDEPENDENT_AMBULATORY_CARE_PROVIDER_SITE_OTHER): Payer: BC Managed Care – PPO | Admitting: Nurse Practitioner

## 2019-06-20 VITALS — BP 122/70 | HR 72 | Temp 97.1°F | Ht 69.0 in | Wt 186.0 lb

## 2019-06-20 DIAGNOSIS — Z1159 Encounter for screening for other viral diseases: Secondary | ICD-10-CM | POA: Diagnosis not present

## 2019-06-20 DIAGNOSIS — R195 Other fecal abnormalities: Secondary | ICD-10-CM | POA: Diagnosis not present

## 2019-06-20 DIAGNOSIS — Z7901 Long term (current) use of anticoagulants: Secondary | ICD-10-CM

## 2019-06-20 MED ORDER — NA SULFATE-K SULFATE-MG SULF 17.5-3.13-1.6 GM/177ML PO SOLN: ORAL | 0 refills | Status: DC

## 2019-06-20 NOTE — Progress Notes (Signed)
ASSESSMENT / PLAN:   48.  58 year old male with positive Cologuard.  For further evaluation patient will be scheduled for colonoscopy. -The risks and benefits of colonoscopy with possible polypectomy / biopsies were discussed and the patient agrees to proceed.   2. Chronic anti-platelet therapy.  -Hold Plavix for 5 days before procedure - will instruct when and how to resume after procedure. Patient understands that there is a low but real risk of cardiovascular event such as heart attack, stroke, or embolism /  thrombosis, or ischemia while off Plavix. The patient consents to proceed. Will communicate by phone or EMR with patient's prescribing provider to confirm that holding Plavix is reasonable in this case.   HPI:    Referring Provider:      Janace Litten, MD Reason for referral:    Positive Cologuard  Chief Complaint:   Positive Cologuard  Patient is a 58 year old male with PMH significant for hypothyroidism, DM2, hypertension, hyperlipidemia, CAD, hyperlipidemia.  He is on chronic Plavix.  Patient is scheduled for colonoscopy on 07/24/2019 but required appointment today when it was determined that he takes Plavix.  Patient has not seen his cardiologist in a couple of years, he is scheduled to see him the beginning of December.  Right now Dr. Unk Lightning is in charge of his Plavix.  Patient has no GI symptoms.  Specifically, no abdominal pain, no bowel changes, no overt GI bleeding.  He has no general medical complaints other than headaches.   Past Medical History:  Diagnosis Date  . Anxiety   . Elevated cholesterol   . Hypertension   . Pneumonia   . Pre-diabetes      Past Surgical History:  Procedure Laterality Date  . CORONARY ANGIOPLASTY WITH STENT PLACEMENT  12/29/2011  . stitches to left hand     Family History  Problem Relation Age of Onset  . Kidney cancer Father   . Heart disease Father    Social History   Tobacco Use  . Smoking status: Former  Smoker    Packs/day: 0.50  . Smokeless tobacco: Never Used  Substance Use Topics  . Alcohol use: Yes    Alcohol/week: 1.0 - 6.0 standard drinks    Types: 1 - 6 Cans of beer per week  . Drug use: Not on file   Current Outpatient Medications  Medication Sig Dispense Refill  . ALPRAZolam (XANAX) 1 MG tablet Take 1 mg by mouth at bedtime as needed.    Marland Kitchen aspirin EC 81 MG tablet Take by mouth.    . clarithromycin (BIAXIN) 500 MG tablet Take 500 mg by mouth 2 (two) times daily.    . clopidogrel (PLAVIX) 75 MG tablet TAKE 1 TABLET BY MOUTH DAILY    . empagliflozin (JARDIANCE) 10 MG TABS tablet Take by mouth.    Marland Kitchen HYDROcodone-acetaminophen (VICODIN) 5-500 MG per tablet Take 1 tablet by mouth every 6 (six) hours as needed. For pain    . isosorbide mononitrate (IMDUR) 60 MG 24 hr tablet Take by mouth.    . levocetirizine (XYZAL) 5 MG tablet Take 5 mg by mouth daily.    Marland Kitchen levothyroxine (SYNTHROID) 125 MCG tablet Take 125 mcg by mouth daily.    . metFORMIN (GLUCOPHAGE) 1000 MG tablet Take 1,000 mg by mouth 2 (two) times daily.    . metoprolol tartrate (LOPRESSOR) 25 MG tablet Take 25 mg by mouth 2 (two) times daily.    Marland Kitchen  montelukast (SINGULAIR) 10 MG tablet Take 10 mg by mouth at bedtime.    . nitroGLYCERIN (NITROSTAT) 0.4 MG SL tablet PLEASE SEE ATTACHED FOR DETAILED DIRECTIONS    . omeprazole (PRILOSEC) 20 MG capsule Take 20 mg by mouth daily as needed. For heart burn    . pravastatin (PRAVACHOL) 80 MG tablet Take 80 mg by mouth daily.    . sildenafil (VIAGRA) 100 MG tablet TAKE 1 TABLET BY MOUTH EVERY DAY AS NEEDED    . valsartan-hydrochlorothiazide (DIOVAN-HCT) 320-12.5 MG per tablet Take 1 tablet by mouth daily.     No current facility-administered medications for this visit.    No Known Allergies   Review of Systems: Positive for headaches and shortness of breath.  All systems reviewed and negative except where noted in HPI.    Physical Exam:    Wt Readings from Last 3 Encounters:   06/20/19 186 lb (84.4 kg)    BP 122/70   Pulse 72   Temp (!) 97.1 F (36.2 C)   Ht 5\' 9"  (1.753 m)   Wt 186 lb (84.4 kg)   BMI 27.47 kg/m  Constitutional:  Pleasant male in no acute distress. Psychiatric: Normal mood and affect. Behavior is normal. EENT: Pupils normal.  Conjunctivae are normal. No scleral icterus. Neck supple.  Cardiovascular: Normal rate, regular rhythm.  Murmur is present.  No edema Pulmonary/chest: Effort normal and breath sounds normal. No wheezing, rales or rhonchi. Abdominal: Soft, nondistended, nontender. Bowel sounds active throughout. There are no masses palpable. No hepatomegaly. Neurological: Alert and oriented to person place and time. Skin: Skin is warm and dry. No rashes noted.  Tye Savoy, NP  06/20/2019, 10:52 AM  Cc: Myrlene Broker, MD

## 2019-06-20 NOTE — Patient Instructions (Signed)
If you are age 60 or older, your body mass index should be between 23-30. Your Body mass index is 27.47 kg/m. If this is out of the aforementioned range listed, please consider follow up with your Primary Care Provider.  If you are age 68 or younger, your body mass index should be between 19-25. Your Body mass index is 27.47 kg/m. If this is out of the aformentioned range listed, please consider follow up with your Primary Care Provider.   You have been scheduled for a colonoscopy. Please follow written instructions given to you at your visit today.  Please pick up your prep supplies at the pharmacy within the next 1-3 days. If you use inhalers (even only as needed), please bring them with you on the day of your procedure. Your physician has requested that you go to www.startemmi.com and enter the access code given to you at your visit today. This web site gives a general overview about your procedure. However, you should still follow specific instructions given to you by our office regarding your preparation for the procedure.  We have sent the following medications to your pharmacy for you to pick up at your convenience: [place] will be contacted by our office prior to your procedure for directions on holding your Plavix.  If you do not hear from our office 1 week prior to your scheduled procedure, please call [phone] to discuss.  Thank you for choosing me and [facility].   Tye Savoy, NP

## 2019-06-20 NOTE — Telephone Encounter (Signed)
[facility] [address] [place], [place]  [address] Phone:  [phone]   Fax:  [phone]   06/20/2019   RE:      David Harding DOB:   March 22, 1961 MRN:   [id number]   Dear Dr. Unk Lightning,    We have scheduled the above patient for an endoscopic procedure. Our records show that he is on anticoagulation therapy.   Please advise as to whether the patient may come off his therapy of Plavix five days prior to the colonoscopy procedure, which is scheduled for 07/24/19.  Please fax back/ or route to [place], [place] at [phone].   Sincerely,    Thurmon Fair, RMA

## 2019-06-24 ENCOUNTER — Encounter: Payer: Self-pay | Admitting: Nurse Practitioner

## 2019-06-25 NOTE — Telephone Encounter (Signed)
Received letter back from Dr. Unk Lightning.  He has okayed patient to hold plavix five days prior to colonoscopy.  Pt advised. Pt verbalized understanding.  Letter scanned to chart.

## 2019-06-26 ENCOUNTER — Encounter: Payer: Self-pay | Admitting: Gastroenterology

## 2019-06-26 NOTE — Progress Notes (Signed)
Thanks a lot [facility] with the above note Proceed with colonoscopy after holding Plavix for 5 days Thx RG

## 2019-07-08 ENCOUNTER — Ambulatory Visit (INDEPENDENT_AMBULATORY_CARE_PROVIDER_SITE_OTHER): Payer: BC Managed Care – PPO | Admitting: Cardiology

## 2019-07-08 ENCOUNTER — Encounter: Payer: Self-pay | Admitting: *Deleted

## 2019-07-08 ENCOUNTER — Other Ambulatory Visit: Payer: Self-pay

## 2019-07-08 ENCOUNTER — Telehealth: Payer: Self-pay

## 2019-07-08 VITALS — BP 150/90 | HR 65 | Ht 69.0 in | Wt 187.0 lb

## 2019-07-08 DIAGNOSIS — E785 Hyperlipidemia, unspecified: Secondary | ICD-10-CM | POA: Diagnosis not present

## 2019-07-08 DIAGNOSIS — I2510 Atherosclerotic heart disease of native coronary artery without angina pectoris: Secondary | ICD-10-CM | POA: Diagnosis not present

## 2019-07-08 DIAGNOSIS — E1165 Type 2 diabetes mellitus with hyperglycemia: Secondary | ICD-10-CM

## 2019-07-08 DIAGNOSIS — I209 Angina pectoris, unspecified: Secondary | ICD-10-CM

## 2019-07-08 NOTE — H&P (View-Only) (Signed)
Cardiology Office Note:    Date:  07/08/2019   ID:  [name], DOB 1960/10/24, MRN [id number]  PCP:  Myrlene Broker, MD  Cardiologist:  Jenean Lindau, MD   Referring MD: Myrlene Broker, MD    ASSESSMENT:    1. Angina pectoris ([place])   2. Dyslipidemia   3. Type 2 diabetes mellitus with hyperglycemia, without long-term current use of insulin ([place])   4. Coronary artery disease involving native coronary artery of native heart, angina presence unspecified    PLAN:    In order of problems listed above:  1. Angina pectoris: Patient symptoms are very concerning.  At this time he is pain-free and asymptomatic.  I will obtain blood work and troponin today.  I reviewed his EKG and discussed it with him at length.In view of the patient's symptoms, I discussed with the patient options for evaluation. Invasive and noninvasive options were given to the patient. I discussed stress testing and coronary angiography and left heart catheterization at length. Benefits, pros and cons of each approach were discussed at length. Patient had multiple questions which were answered to the patient's satisfaction. Patient opted for invasive evaluation and we will set up for coronary angiography and left heart catheterization. Further recommendations will be made based on the findings with coronary angiography. In the interim if the patient has any significant symptoms in hospital to the nearest emergency room.  I will continue his dual antiplatelet therapy in view of his current symptoms. 2. Sublingual nitroglycerin prescription was sent, its protocol and 911 protocol explained and the patient vocalized understanding questions were answered to the patient's satisfaction 3. Essential hypertension: Blood pressure stable.  He seems to be a little anxious today and has an element of whitecoat hypertension. 4. Mixed dyslipidemia: Lipids are followed by primary care physician. 5. Patient mentions to me that  he had a Cologuard test and it was positive and he is being told that he will need colonoscopy.  This will be important in making decisions should he need intervention.  He will be seen in follow-up appointment after the coronary angiography.  He had multiple questions which were answered to satisfaction.  He is a former smoker and promises never to go back to smoking.   Medication Adjustments/Labs and Tests Ordered: Current medicines are reviewed at length with the patient today.  Concerns regarding medicines are outlined above.  No orders of the defined types were placed in this encounter.  No orders of the defined types were placed in this encounter.    Chief Complaint  Patient presents with  . Chest Pain     History of Present Illness:    David Harding is a 58 y.o. male.  Patient has past medical history of nonobstructive coronary artery disease by coronary angiography about 3 years ago.  He has history of essential hypertension.  He is an ex-smoker.  He has dyslipidemia.  He is on dual antiplatelet therapy.  He mentions to me that he has been having chest pain.  This patient has been under my care in my previous practice.  He is here now to transfer his care and be established with my current practice.  Is been a couple of years since I saw him last.  He mentions to me that he has chest tightness on exertion and this goes to the neck into the arm.  Along with that he had headaches when he has chest pain.  Nitroglycerin relieved the pain.  The last time he was sexually active he had to stop in the middle of his sexual intercourse because of chest tightness.  At the time of my evaluation, the patient is alert awake oriented and in no distress.  Past Medical History:  Diagnosis Date  . Acquired hypothyroidism 07/23/2017  . Acute bilateral ankle pain 03/20/2017  . Annual physical exam 07/23/2017  . Anxiety   . Chronic insomnia 03/08/2018  . Coronary atherosclerosis of native coronary  artery 07/05/2016  . Dyslipidemia 07/05/2016  . Elevated cholesterol   . Erectile dysfunction 07/23/2017  . Essential hypertension 07/05/2016  . GERD without esophagitis 03/08/2018  . Heel pain, bilateral 10/31/2017  . History of participation in smoking cessation counseling 05/30/2017  . Hypertension   . Lumbar back pain 03/08/2018  . Pneumonia   . Pre-diabetes   . Primary insomnia 10/17/2018  . Right knee pain 03/08/2018  . Tobacco abuse 10/17/2018  . Type 2 diabetes mellitus with hyperglycemia, without long-term current use of insulin ([place]) 07/23/2017    Past Surgical History:  Procedure Laterality Date  . CORONARY ANGIOPLASTY WITH STENT PLACEMENT  12/29/2011  . stitches to left hand      Current Medications: Current Meds  Medication Sig  . ALPRAZolam (XANAX) 1 MG tablet Take 1 mg by mouth at bedtime as needed.  Marland Kitchen aspirin EC 81 MG tablet Take by mouth.  . clopidogrel (PLAVIX) 75 MG tablet TAKE 1 TABLET BY MOUTH DAILY  . empagliflozin (JARDIANCE) 10 MG TABS tablet Take 10 mg by mouth daily.   Marland Kitchen HYDROcodone-acetaminophen (VICODIN) 5-500 MG per tablet Take 1 tablet by mouth as needed. For pain   . isosorbide mononitrate (IMDUR) 60 MG 24 hr tablet Take 60 mg by mouth 2 (two) times daily.   Marland Kitchen levothyroxine (SYNTHROID) 125 MCG tablet Take 125 mcg by mouth daily.  . metFORMIN (GLUCOPHAGE) 1000 MG tablet Take 1,000 mg by mouth 2 (two) times daily.  . metoprolol tartrate (LOPRESSOR) 25 MG tablet Take 25 mg by mouth 2 (two) times daily.  . nitroGLYCERIN (NITROSTAT) 0.4 MG SL tablet PLEASE SEE ATTACHED FOR DETAILED DIRECTIONS  . omeprazole (PRILOSEC) 20 MG capsule Take 20 mg by mouth daily as needed. For heart burn  . pravastatin (PRAVACHOL) 80 MG tablet Take 80 mg by mouth daily.  . sildenafil (VIAGRA) 100 MG tablet TAKE 1 TABLET BY MOUTH EVERY DAY AS NEEDED  . valsartan-hydrochlorothiazide (DIOVAN-HCT) 80-12.5 MG tablet Take 1 tablet by mouth daily.     Allergies:   Patient has no known  allergies.   Social History   Socioeconomic History  . Marital status: Single    Spouse name: Not on file  . Number of children: Not on file  . Years of education: Not on file  . Highest education level: Not on file  Occupational History  . Occupation: maintenance  Social Needs  . Financial resource strain: Not on file  . Food insecurity    Worry: Not on file    Inability: Not on file  . Transportation needs    Medical: Not on file    Non-medical: Not on file  Tobacco Use  . Smoking status: Former Smoker    Packs/day: 0.50  . Smokeless tobacco: Never Used  Substance and Sexual Activity  . Alcohol use: Yes    Alcohol/week: 1.0 - 6.0 standard drinks    Types: 1 - 6 Cans of beer per week  . Drug use: Not on file  . Sexual activity: Not on file  Lifestyle  . Physical activity    Days per week: Not on file    Minutes per session: Not on file  . Stress: Not on file  Relationships  . Social Herbalist on phone: Not on file    Gets together: Not on file    Attends religious service: Not on file    Active member of club or organization: Not on file    Attends meetings of clubs or organizations: Not on file    Relationship status: Not on file  Other Topics Concern  . Not on file  Social History Narrative  . Not on file     Family History: The patient's family history includes Heart disease in his father; Kidney cancer in his father.  ROS:   Please see the history of present illness.    All other systems reviewed and are negative.  EKGs/Labs/Other Studies Reviewed:    The following studies were reviewed today: EKG reveals sinus rhythm and nonspecific ST-T changes.   Recent Labs: No results found for requested labs within last 8760 hours.  Recent Lipid Panel    Component Value Date/Time   CHOL  05/03/2010 0503    189        ATP III CLASSIFICATION:  <200     mg/dL   Desirable  200-239  mg/dL   Borderline High  >=240    mg/dL   High          TRIG  101 05/03/2010 0503   HDL 44 05/03/2010 0503   CHOLHDL 4.3 05/03/2010 0503   VLDL 20 05/03/2010 0503   LDLCALC (H) 05/03/2010 0503    125        Total Cholesterol/HDL:CHD Risk Coronary Heart Disease Risk Table                     Men   Women  1/2 Average Risk   3.4   3.3  Average Risk       5.0   4.4  2 X Average Risk   9.6   7.1  3 X Average Risk  23.4   11.0        Use the calculated Patient Ratio above and the CHD Risk Table to determine the patient's CHD Risk.        ATP III CLASSIFICATION (LDL):  <100     mg/dL   Optimal  100-129  mg/dL   Near or Above                    Optimal  130-159  mg/dL   Borderline  160-189  mg/dL   High  >190     mg/dL   Very High    Physical Exam:    VS:  BP (!) 150/90 (BP Location: Left Arm, Patient Position: Sitting, Cuff Size: Normal)   Pulse 65   Ht 5\' 9"  (1.753 m)   Wt 187 lb (84.8 kg)   SpO2 98%   BMI 27.62 kg/m     Wt Readings from Last 3 Encounters:  07/08/19 187 lb (84.8 kg)  06/20/19 186 lb (84.4 kg)     GEN: Patient is in no acute distress HEENT: Normal NECK: No JVD; No carotid bruits LYMPHATICS: No lymphadenopathy CARDIAC: Hear sounds regular, 2/6 systolic murmur at the apex. RESPIRATORY:  Clear to auscultation without rales, wheezing or rhonchi  ABDOMEN: Soft, non-tender, non-distended MUSCULOSKELETAL:  No edema; No deformity  SKIN: Warm and dry NEUROLOGIC:  Alert and oriented x 3 PSYCHIATRIC:  Normal affect   Signed, Jenean Lindau, MD  07/08/2019 9:38 AM    [place]

## 2019-07-08 NOTE — Patient Instructions (Addendum)
Medication Instructions:  Your physician recommends that you continue on your current medications as directed. Please refer to the Current Medication list given to you today.  *If you need a refill on your cardiac medications before your next appointment, please call your pharmacy*  Lab Work: Your physician recommends that you have a BMP, CBC and troponin drawn today  If you have labs (blood work) drawn today and your tests are completely normal, you will receive your results only by: Marland Kitchen MyChart Message (if you have MyChart) OR . A paper copy in the mail If you have any lab test that is abnormal or we need to change your treatment, we will call you to review the results.  Testing/Procedures: You had an EKG Performed today  A chest x-ray takes a picture of the organs and structures inside the chest, including the heart, lungs, and blood vessels. This test can show several things, including, whether the heart is enlarges; whether fluid is building up in the lungs; and whether pacemaker / defibrillator leads are still in place.  Your physician has requested that you have a cardiac catheterization. Cardiac catheterization is used to diagnose and/or treat various heart conditions. Doctors may recommend this procedure for a number of different reasons. The most common reason is to evaluate chest pain. Chest pain can be a symptom of coronary artery disease (CAD), and cardiac catheterization can show whether plaque is narrowing or blocking your heart's arteries. This procedure is also used to evaluate the valves, as well as measure the blood flow and oxygen levels in different parts of your heart. For further information please visit [email or web address]. Please follow instruction sheet, as given.   You are scheduled for CVD19 screening on 07/11/2019 at 3:15 pm at [place], [place]. Arrive 10 min early and get in prescreening line    [place] [address] Dept: [phone] Loc: [phone]  DAYSON SPEIER  07/08/2019  You are scheduled for a Cardiac Catheterization on Monday, December 7 with Dr. Quay Burow.  1. Please arrive at the [facility] (Main Entrance A) at [facility]: [address] [place], [place] [address] at 12:30 PM (This time is two hours before your procedure to ensure your preparation). Free valet parking service is available.   Special note: Every effort is made to have your procedure done on time. Please understand that emergencies sometimes delay scheduled procedures.  2. Diet: Do not eat solid foods after midnight.  The patient may have clear liquids until 5am upon the day of the procedure.  3. Labs:NONE 4. Medication instructions in preparation for your procedure:   Contrast Allergy: No    Stop taking, Sildenafil Friday, December 4,  Stop taking, Valsartan-HCTZ Sunday, December 6,  Do not take Diabetes Med Glucophage (Metformin) on the day of the procedure and HOLD 48 HOURS AFTER THE PROCEDURE.  On the morning of your procedure, take your Aspirin and Plavix/Clopidogrel and any morning medicines NOT listed above.  You may use sips of water.  5. Plan for one night stay--bring personal belongings. 6. Bring a current list of your medications and current insurance cards. 7. You MUST have a responsible person to drive you home. 8. Someone MUST be with you the first 24 hours after you arrive home or your discharge will be delayed. 9. Please wear clothes that are easy to get on and off and wear slip-on shoes.  Thank you for allowing [place] to care for you!   -- [facility]   Follow-Up: At [facility], you and your health needs are our priority.  As part of our continuing mission to provide you with exceptional heart care, we have created designated Provider Care Teams.  These Care Teams  include your primary Cardiologist (physician) and Advanced Practice Providers (APPs -  Physician Assistants and Nurse Practitioners) who all work together to provide you with the care you need, when you need it.  Your next appointment:   1 month(s)  The format for your next appointment:   In Person  Provider:   [name], MD  Other Instructions  Coronary Angiogram A coronary angiogram is an X-ray procedure that is used to examine the arteries in the heart. In this procedure, a dye (contrast dye) is injected through a long, thin tube (catheter). The catheter is inserted through the groin, wrist, or arm. The dye is injected into each artery, then X-rays are taken to show if there is a blockage in the arteries of the heart. This procedure can also show if you have valve disease or a disease of the aorta, and it can be used to check the overall function of your heart muscle. You may have a coronary angiogram if:  You are having chest pain, or other symptoms of angina, and you are at risk for heart disease.  You have an abnormal electrocardiogram (ECG) or stress test.  You have chest pain and heart failure.  You are having irregular heart rhythms.  You and your health care provider determine that the benefits of the test information outweigh the risks of the procedure. Let your health care provider know about:  Any allergies you have, including allergies to contrast dye.  All medicines you are taking, including vitamins, herbs, eye drops, creams, and over-the-counter medicines.  Any problems you or family members have had with anesthetic medicines.  Any blood disorders you have.  Any surgeries you have had.  History of kidney problems or kidney failure.  Any medical conditions you have.  Whether you are pregnant or may be pregnant. What are the risks? Generally, this is a safe procedure. However, problems may occur, including:  Infection.  Allergic reaction to medicines  or dyes that are used.  Bleeding from the access site or other locations.  Kidney injury, especially in people with impaired kidney function.  Stroke (rare).  Heart attack (rare).  Damage to other structures or organs. What happens before the procedure? Staying hydrated Follow instructions from your health care provider about hydration, which may include:  Up to 2 hours before the procedure - you may continue to drink clear liquids, such as water, clear fruit juice, black coffee, and plain tea. Eating and drinking restrictions Follow instructions from your health care provider about eating and drinking, which may include:  8 hours before the procedure - stop eating heavy meals or foods such as meat, fried foods, or fatty foods.  6 hours before the procedure - stop eating light meals or foods, such as toast or cereal.  2 hours before the procedure - stop drinking clear liquids. General instructions  Ask your health care provider about: ? Changing or stopping your regular medicines. This is especially important if you are taking diabetes medicines or blood thinners. ? Taking medicines such as ibuprofen. These medicines can thin your blood. Do not take these medicines before your procedure if your health care provider instructs you  not to, though aspirin may be recommended prior to coronary angiograms.  Plan to have someone take you home from the hospital or clinic.  You may need to have blood tests or X-rays done. What happens during the procedure?  An IV tube will be inserted into one of your veins.  You will be given one or more of the following: ? A medicine to help you relax (sedative). ? A medicine to numb the area where the catheter will be inserted into an artery (local anesthetic).  To reduce your risk of infection: ? Your health care team will wash or sanitize their hands. ? Your skin will be washed with soap. ? Hair may be removed from the area where the catheter  will be inserted.  You will be connected to a continuous ECG monitor.  The catheter will be inserted into an artery. The location may be in your groin, in your wrist, or in the fold of your arm (near your elbow).  A type of X-ray (fluoroscopy) will be used to help guide the catheter to the opening of the blood vessel that is being examined.  A dye will be injected into the catheter, and X-rays will be taken. The dye will help to show where any narrowing or blockages are located in the heart arteries.  Tell your health care provider if you have any chest pain or trouble breathing during the procedure.  If blockages are found, your health care provider may perform another procedure, such as inserting a coronary stent. The procedure may vary among health care providers and hospitals. What happens after the procedure?  After the procedure, you will need to keep the area still for a few hours, or for as long as told by your health care provider. If the procedure is done through the groin, you will be instructed to not bend and not cross your legs.  The insertion site will be checked frequently.  The pulse in your foot or wrist will be checked frequently.  You may have additional blood tests, X-rays, and a test that records the electrical activity of your heart (ECG).  Do not drive for 24 hours if you were given a sedative. Summary  A coronary angiogram is an X-ray procedure that is used to look into the arteries in the heart.  During the procedure, a dye (contrast dye) is injected through a long, thin tube (catheter). The catheter is inserted through the groin, wrist, or arm.  Tell your health care provider about any allergies you have, including allergies to contrast dye.  After the procedure, you will need to keep the area still for a few hours, or for as long as told by your health care provider. This information is not intended to replace advice given to you by your health care  provider. Make sure you discuss any questions you have with your health care provider. Document Released: 01/28/2003 Document Revised: 07/06/2017 Document Reviewed: 05/05/2016 Elsevier Patient Education  2020 [employer].

## 2019-07-08 NOTE — Telephone Encounter (Signed)
Pt s/p office visit with Dr. Geraldo Pitter this morning. States he has some questions since this visit, tried to call office to ask but was on hold for about an hour so just came by office. He has more questions about stopping plavix prior to colonoscopy on 07/24/19. States his understanding from visit with Dr. Geraldo Pitter this morning was that he is to continue plavix (not stop it at all) prior to his colonoscopy. He wants to make sure he understood that correctly.  He also has a dental appt. tomorrow and may require a tooth extraction. He wonders if plavix should be stopped before that and if so, he will postpone the dental visit/procedure. Requesting an answer this afternoon if possible.

## 2019-07-08 NOTE — Telephone Encounter (Signed)
Phoned patient, informed to continue plavix uninterrupted and postpone colonoscopy and dental visit/potential extraction. He verbalized understanding, no further questions or concerns.

## 2019-07-08 NOTE — Progress Notes (Signed)
Cardiology Office Note:    Date:  07/08/2019   ID:  David Harding, DOB [birth year], MRN [id number]  PCP:  Myrlene Broker, MD  Cardiologist:  Jenean Lindau, MD   Referring MD: Myrlene Broker, MD    ASSESSMENT:    1. Angina pectoris ([place])   2. Dyslipidemia   3. Type 2 diabetes mellitus with hyperglycemia, without long-term current use of insulin ([place])   4. Coronary artery disease involving native coronary artery of native heart, angina presence unspecified    PLAN:    In order of problems listed above:  1. Angina pectoris: Patient symptoms are very concerning.  At this time he is pain-free and asymptomatic.  I will obtain blood work and troponin today.  I reviewed his EKG and discussed it with him at length.In view of the patient's symptoms, I discussed with the patient options for evaluation. Invasive and noninvasive options were given to the patient. I discussed stress testing and coronary angiography and left heart catheterization at length. Benefits, pros and cons of each approach were discussed at length. Patient had multiple questions which were answered to the patient's satisfaction. Patient opted for invasive evaluation and we will set up for coronary angiography and left heart catheterization. Further recommendations will be made based on the findings with coronary angiography. In the interim if the patient has any significant symptoms in hospital to the nearest emergency room.  I will continue his dual antiplatelet therapy in view of his current symptoms. 2. Sublingual nitroglycerin prescription was sent, its protocol and 911 protocol explained and the patient vocalized understanding questions were answered to the patient's satisfaction 3. Essential hypertension: Blood pressure stable.  He seems to be a little anxious today and has an element of whitecoat hypertension. 4. Mixed dyslipidemia: Lipids are followed by primary care physician. 5. Patient mentions to me that  he had a Cologuard test and it was positive and he is being told that he will need colonoscopy.  This will be important in making decisions should he need intervention.  He will be seen in follow-up appointment after the coronary angiography.  He had multiple questions which were answered to satisfaction.  He is a former smoker and promises never to go back to smoking.   Medication Adjustments/Labs and Tests Ordered: Current medicines are reviewed at length with the patient today.  Concerns regarding medicines are outlined above.  No orders of the defined types were placed in this encounter.  No orders of the defined types were placed in this encounter.    Chief Complaint  Patient presents with  . Chest Pain     History of Present Illness:    David Harding is a 58 y.o. male.  Patient has past medical history of nonobstructive coronary artery disease by coronary angiography about 3 years ago.  He has history of essential hypertension.  He is an ex-smoker.  He has dyslipidemia.  He is on dual antiplatelet therapy.  He mentions to me that he has been having chest pain.  This patient has been under my care in my previous practice.  He is here now to transfer his care and be established with my current practice.  Is been a couple of years since I saw him last.  He mentions to me that he has chest tightness on exertion and this goes to the neck into the arm.  Along with that he had headaches when he has chest pain.  Nitroglycerin relieved the pain.  The last time he was sexually active he had to stop in the middle of his sexual intercourse because of chest tightness.  At the time of my evaluation, the patient is alert awake oriented and in no distress.  Past Medical History:  Diagnosis Date  . Acquired hypothyroidism 07/23/2017  . Acute bilateral ankle pain 03/20/2017  . Annual physical exam 07/23/2017  . Anxiety   . Chronic insomnia 03/08/2018  . Coronary atherosclerosis of native coronary  artery 07/05/2016  . Dyslipidemia 07/05/2016  . Elevated cholesterol   . Erectile dysfunction 07/23/2017  . Essential hypertension 07/05/2016  . GERD without esophagitis 03/08/2018  . Heel pain, bilateral 10/31/2017  . History of participation in smoking cessation counseling 05/30/2017  . Hypertension   . Lumbar back pain 03/08/2018  . Pneumonia   . Pre-diabetes   . Primary insomnia 10/17/2018  . Right knee pain 03/08/2018  . Tobacco abuse 10/17/2018  . Type 2 diabetes mellitus with hyperglycemia, without long-term current use of insulin ([place]) 07/23/2017    Past Surgical History:  Procedure Laterality Date  . CORONARY ANGIOPLASTY WITH STENT PLACEMENT  12/29/2011  . stitches to left hand      Current Medications: Current Meds  Medication Sig  . ALPRAZolam (XANAX) 1 MG tablet Take 1 mg by mouth at bedtime as needed.  Marland Kitchen aspirin EC 81 MG tablet Take by mouth.  . clopidogrel (PLAVIX) 75 MG tablet TAKE 1 TABLET BY MOUTH DAILY  . empagliflozin (JARDIANCE) 10 MG TABS tablet Take 10 mg by mouth daily.   Marland Kitchen HYDROcodone-acetaminophen (VICODIN) 5-500 MG per tablet Take 1 tablet by mouth as needed. For pain   . isosorbide mononitrate (IMDUR) 60 MG 24 hr tablet Take 60 mg by mouth 2 (two) times daily.   Marland Kitchen levothyroxine (SYNTHROID) 125 MCG tablet Take 125 mcg by mouth daily.  . metFORMIN (GLUCOPHAGE) 1000 MG tablet Take 1,000 mg by mouth 2 (two) times daily.  . metoprolol tartrate (LOPRESSOR) 25 MG tablet Take 25 mg by mouth 2 (two) times daily.  . nitroGLYCERIN (NITROSTAT) 0.4 MG SL tablet PLEASE SEE ATTACHED FOR DETAILED DIRECTIONS  . omeprazole (PRILOSEC) 20 MG capsule Take 20 mg by mouth daily as needed. For heart burn  . pravastatin (PRAVACHOL) 80 MG tablet Take 80 mg by mouth daily.  . sildenafil (VIAGRA) 100 MG tablet TAKE 1 TABLET BY MOUTH EVERY DAY AS NEEDED  . valsartan-hydrochlorothiazide (DIOVAN-HCT) 80-12.5 MG tablet Take 1 tablet by mouth daily.     Allergies:   Patient has no known  allergies.   Social History   Socioeconomic History  . Marital status: Single    Spouse name: Not on file  . Number of children: Not on file  . Years of education: Not on file  . Highest education level: Not on file  Occupational History  . Occupation: maintenance  Social Needs  . Financial resource strain: Not on file  . Food insecurity    Worry: Not on file    Inability: Not on file  . Transportation needs    Medical: Not on file    Non-medical: Not on file  Tobacco Use  . Smoking status: Former Smoker    Packs/day: 0.50  . Smokeless tobacco: Never Used  Substance and Sexual Activity  . Alcohol use: Yes    Alcohol/week: 1.0 - 6.0 standard drinks    Types: 1 - 6 Cans of beer per week  . Drug use: Not on file  . Sexual activity: Not on file  Lifestyle  . Physical activity    Days per week: Not on file    Minutes per session: Not on file  . Stress: Not on file  Relationships  . Social Herbalist on phone: Not on file    Gets together: Not on file    Attends religious service: Not on file    Active member of club or organization: Not on file    Attends meetings of clubs or organizations: Not on file    Relationship status: Not on file  Other Topics Concern  . Not on file  Social History Narrative  . Not on file     Family History: The patient's family history includes Heart disease in his father; Kidney cancer in his father.  ROS:   Please see the history of present illness.    All other systems reviewed and are negative.  EKGs/Labs/Other Studies Reviewed:    The following studies were reviewed today: EKG reveals sinus rhythm and nonspecific ST-T changes.   Recent Labs: No results found for requested labs within last 8760 hours.  Recent Lipid Panel    Component Value Date/Time   CHOL  05/03/2010 0503    189        ATP III CLASSIFICATION:  <200     mg/dL   Desirable  200-239  mg/dL   Borderline High  >=240    mg/dL   High          TRIG  101 05/03/2010 0503   HDL 44 05/03/2010 0503   CHOLHDL 4.3 05/03/2010 0503   VLDL 20 05/03/2010 0503   LDLCALC (H) 05/03/2010 0503    125        Total Cholesterol/HDL:CHD Risk Coronary Heart Disease Risk Table                     Men   Women  1/2 Average Risk   3.4   3.3  Average Risk       5.0   4.4  2 X Average Risk   9.6   7.1  3 X Average Risk  23.4   11.0        Use the calculated Patient Ratio above and the CHD Risk Table to determine the patient's CHD Risk.        ATP III CLASSIFICATION (LDL):  <100     mg/dL   Optimal  100-129  mg/dL   Near or Above                    Optimal  130-159  mg/dL   Borderline  160-189  mg/dL   High  >190     mg/dL   Very High    Physical Exam:    VS:  BP (!) 150/90 (BP Location: Left Arm, Patient Position: Sitting, Cuff Size: Normal)   Pulse 65   Ht 5\' 9"  (1.753 m)   Wt 187 lb (84.8 kg)   SpO2 98%   BMI 27.62 kg/m     Wt Readings from Last 3 Encounters:  07/08/19 187 lb (84.8 kg)  06/20/19 186 lb (84.4 kg)     GEN: Patient is in no acute distress HEENT: Normal NECK: No JVD; No carotid bruits LYMPHATICS: No lymphadenopathy CARDIAC: Hear sounds regular, 2/6 systolic murmur at the apex. RESPIRATORY:  Clear to auscultation without rales, wheezing or rhonchi  ABDOMEN: Soft, non-tender, non-distended MUSCULOSKELETAL:  No edema; No deformity  SKIN: Warm and dry NEUROLOGIC:  Alert and oriented x 3 PSYCHIATRIC:  Normal affect   Signed, Jenean Lindau, MD  07/08/2019 9:38 AM    [place]

## 2019-07-08 NOTE — Telephone Encounter (Signed)
He should postpone both his appointments and continue his Plavix uninterrupted.

## 2019-07-09 ENCOUNTER — Ambulatory Visit: Payer: BC Managed Care – PPO | Admitting: Cardiology

## 2019-07-09 LAB — BASIC METABOLIC PANEL
BUN/Creatinine Ratio: 11 (ref 10–24)
BUN: 9 mg/dL (ref 8–27)
CO2: 27 mmol/L (ref 20–29)
Calcium: 10.3 mg/dL — ABNORMAL HIGH (ref 8.6–10.2)
Chloride: 98 mmol/L (ref 96–106)
Creatinine, Ser: 0.83 mg/dL (ref 0.76–1.27)
GFR calc Af Amer: 111 mL/min/{1.73_m2} (ref >59)
GFR calc non Af Amer: 96 mL/min/{1.73_m2} (ref >59)
Glucose: 118 mg/dL — ABNORMAL HIGH (ref 65–99)
Potassium: 5.5 mmol/L — ABNORMAL HIGH (ref 3.5–5.2)
Sodium: 140 mmol/L (ref 134–144)

## 2019-07-09 LAB — CBC
Hematocrit: 43.3 % (ref 37.5–51.0)
Hemoglobin: 15.3 g/dL (ref 13.0–17.7)
MCH: 32.9 pg (ref 26.6–33.0)
MCHC: 35.3 g/dL (ref 31.5–35.7)
MCV: 93 fL (ref 79–97)
Platelets: 122 10*3/uL — ABNORMAL LOW (ref 150–450)
RBC: 4.65 x10E6/uL (ref 4.14–5.80)
RDW: 13 % (ref 11.6–15.4)
WBC: 5.7 10*3/uL (ref 3.4–10.8)

## 2019-07-09 LAB — TROPONIN I: Troponin I: <0.01 ng/mL (ref 0.00–0.04)

## 2019-07-10 ENCOUNTER — Telehealth: Payer: Self-pay | Admitting: Cardiology

## 2019-07-10 ENCOUNTER — Telehealth: Payer: Self-pay | Admitting: *Deleted

## 2019-07-10 ENCOUNTER — Telehealth: Payer: Self-pay

## 2019-07-10 DIAGNOSIS — E875 Hyperkalemia: Secondary | ICD-10-CM

## 2019-07-10 MED ORDER — SODIUM POLYSTYRENE SULFONATE 15 GM/60ML PO SUSP: 15.0000 g | Freq: Once | ORAL | 0 refills | Status: AC

## 2019-07-10 MED ORDER — SODIUM POLYSTYRENE SULFONATE PO POWD: Freq: Once | ORAL | 0 refills | Status: DC

## 2019-07-10 NOTE — Telephone Encounter (Signed)
Pt contacted pre-catheterization scheduled at [facility] for: Monday July 14, 2019 2:30 PM Verified arrival time and place: [facility]) at: 12:30 PM   No solid food after midnight prior to cath, clear liquids until 5 AM day of procedure. Contrast allergy: no  Hold Metformin-day of procedure and 48 hours post procedure. Jardiance-AM of procedure. Valsartan-HCT-AM of procedure. Viagra-48 hours pre procedure.  Except hold medications AM meds can be  taken pre-cath with sip of water including: ASA 81 mg Plavix 75 mg  Confirmed patient has responsible adult to drive home post procedure and observe 24 hours after arriving home: yes  Currently, due to Covid-19 pandemic, only one support person will be allowed with patient. Must be the same support person for that patient's entire stay, will be screened and required to wear a mask. They will be asked to wait in the waiting room for the duration of the patient's stay.  Patients are required to wear a mask when they enter the hospital.      COVID-19 Pre-Screening Questions:  . In the past 7 to 10 days have you had a cough,  shortness of breath, headache, congestion, fever (100 or greater) body aches, chills, sore throat, or sudden loss of taste or sense of smell? Headache-started 1.5 months ago . Have you been around anyone with known Covid 19? no . Have you been around anyone who is awaiting Covid 19 test results in the past 7 to 10 days? no . Have you been around anyone who has been exposed to Covid 19, or has mentioned symptoms of Covid 19 within the past 7 to 10 days? no   I reviewed procedure/mask/visitor instructions, Covid-19 screening questions with patient, he verbalized understanding,thanked me for call.

## 2019-07-10 NOTE — Telephone Encounter (Signed)
RN called [facility] and they do not carry kayexalate.Pharmacist claimed to have called [facility] also and it was not in stock. RN called Set designer. New script sent/patient notified.

## 2019-07-10 NOTE — Telephone Encounter (Signed)
-----   Message from Jenean Lindau, MD sent at 07/09/2019 10:15 AM EST ----- Overall his labs are fine.  Kayexalate 15 g 1 dose only and recheck before cath.  Keep diet low in potassium.  Cc primary care. Jenean Lindau, MD 07/09/2019 10:15 AM

## 2019-07-10 NOTE — Telephone Encounter (Signed)
Results relayed, patient picking up kayexalate today. He will come by tomorrow for repeat BMP prior to cvd19 screening. Copy sent to Dr. Unk Lightning

## 2019-07-10 NOTE — Telephone Encounter (Signed)
Cannot find the medication [facility] wants him to start anywhere

## 2019-07-10 NOTE — Addendum Note (Signed)
Addended by: Beckey Rutter on: 07/10/2019 02:20 PM   Modules accepted: Orders

## 2019-07-10 NOTE — Addendum Note (Signed)
Addended by: Beckey Rutter on: 07/10/2019 03:03 PM   Modules accepted: Orders

## 2019-07-11 ENCOUNTER — Telehealth: Payer: Self-pay

## 2019-07-11 ENCOUNTER — Other Ambulatory Visit (HOSPITAL_COMMUNITY)
Admission: RE | Admit: 2019-07-11 | Discharge: 2019-07-11 | Disposition: A | Discharge status: Home / Self Care | Payer: BC Managed Care – PPO | Source: Ambulatory Visit | Attending: Cardiovascular Disease | Admitting: Cardiovascular Disease

## 2019-07-11 DIAGNOSIS — Z01812 Encounter for preprocedural laboratory examination: Secondary | ICD-10-CM | POA: Insufficient documentation

## 2019-07-11 DIAGNOSIS — Z20828 Contact with and (suspected) exposure to other viral communicable diseases: Secondary | ICD-10-CM | POA: Insufficient documentation

## 2019-07-11 LAB — BASIC METABOLIC PANEL
BUN/Creatinine Ratio: 9 — ABNORMAL LOW (ref 10–24)
BUN: 7 mg/dL — ABNORMAL LOW (ref 8–27)
CO2: 30 mmol/L — ABNORMAL HIGH (ref 20–29)
Calcium: 10.3 mg/dL — ABNORMAL HIGH (ref 8.6–10.2)
Chloride: 98 mmol/L (ref 96–106)
Creatinine, Ser: 0.79 mg/dL (ref 0.76–1.27)
GFR calc Af Amer: 113 mL/min/{1.73_m2} (ref >59)
GFR calc non Af Amer: 98 mL/min/{1.73_m2} (ref >59)
Glucose: 137 mg/dL — ABNORMAL HIGH (ref 65–99)
Potassium: 4.4 mmol/L (ref 3.5–5.2)
Sodium: 139 mmol/L (ref 134–144)

## 2019-07-11 LAB — SARS CORONAVIRUS 2 (TAT 6-24 HRS): SARS Coronavirus 2: NEGATIVE

## 2019-07-11 NOTE — Telephone Encounter (Signed)
Relayed results, copy sent to primary care. No further questions.

## 2019-07-11 NOTE — Telephone Encounter (Signed)
-----   Message from Jenean Lindau, MD sent at 07/11/2019  2:04 PM EST ----- The results of the study is unremarkable. Please inform patient. I will discuss in detail at next appointment. Cc  primary care/referring physician Jenean Lindau, MD 07/11/2019 2:04 PM

## 2019-07-14 ENCOUNTER — Encounter (HOSPITAL_COMMUNITY): Payer: Self-pay | Admitting: Cardiovascular Disease

## 2019-07-14 ENCOUNTER — Ambulatory Visit (HOSPITAL_COMMUNITY)
Admission: RE | Admit: 2019-07-14 | Discharge: 2019-07-14 | Disposition: A | Discharge status: Home / Self Care | Payer: BC Managed Care – PPO | Attending: Cardiovascular Disease | Admitting: Cardiovascular Disease

## 2019-07-14 ENCOUNTER — Ambulatory Visit (HOSPITAL_COMMUNITY)
Admission: RE | Disposition: A | Discharge status: Home / Self Care | Payer: BC Managed Care – PPO | Source: Home / Self Care | Attending: Cardiovascular Disease

## 2019-07-14 ENCOUNTER — Other Ambulatory Visit: Payer: Self-pay

## 2019-07-14 DIAGNOSIS — E785 Hyperlipidemia, unspecified: Secondary | ICD-10-CM | POA: Insufficient documentation

## 2019-07-14 DIAGNOSIS — F419 Anxiety disorder, unspecified: Secondary | ICD-10-CM | POA: Insufficient documentation

## 2019-07-14 DIAGNOSIS — I25119 Atherosclerotic heart disease of native coronary artery with unspecified angina pectoris: Secondary | ICD-10-CM | POA: Insufficient documentation

## 2019-07-14 DIAGNOSIS — E782 Mixed hyperlipidemia: Secondary | ICD-10-CM | POA: Insufficient documentation

## 2019-07-14 DIAGNOSIS — Z79890 Hormone replacement therapy (postmenopausal): Secondary | ICD-10-CM | POA: Insufficient documentation

## 2019-07-14 DIAGNOSIS — I10 Essential (primary) hypertension: Secondary | ICD-10-CM | POA: Diagnosis not present

## 2019-07-14 DIAGNOSIS — I2583 Coronary atherosclerosis due to lipid rich plaque: Secondary | ICD-10-CM | POA: Diagnosis not present

## 2019-07-14 DIAGNOSIS — E1165 Type 2 diabetes mellitus with hyperglycemia: Secondary | ICD-10-CM | POA: Diagnosis not present

## 2019-07-14 DIAGNOSIS — E7800 Pure hypercholesterolemia, unspecified: Secondary | ICD-10-CM | POA: Insufficient documentation

## 2019-07-14 DIAGNOSIS — I209 Angina pectoris, unspecified: Secondary | ICD-10-CM

## 2019-07-14 DIAGNOSIS — Z79899 Other long term (current) drug therapy: Secondary | ICD-10-CM | POA: Insufficient documentation

## 2019-07-14 DIAGNOSIS — Z87891 Personal history of nicotine dependence: Secondary | ICD-10-CM | POA: Insufficient documentation

## 2019-07-14 DIAGNOSIS — Z7982 Long term (current) use of aspirin: Secondary | ICD-10-CM | POA: Insufficient documentation

## 2019-07-14 DIAGNOSIS — E039 Hypothyroidism, unspecified: Secondary | ICD-10-CM | POA: Diagnosis not present

## 2019-07-14 DIAGNOSIS — I2510 Atherosclerotic heart disease of native coronary artery without angina pectoris: Secondary | ICD-10-CM | POA: Diagnosis not present

## 2019-07-14 DIAGNOSIS — K219 Gastro-esophageal reflux disease without esophagitis: Secondary | ICD-10-CM | POA: Insufficient documentation

## 2019-07-14 DIAGNOSIS — Z7984 Long term (current) use of oral hypoglycemic drugs: Secondary | ICD-10-CM | POA: Insufficient documentation

## 2019-07-14 HISTORY — PX: LEFT HEART CATH AND CORONARY ANGIOGRAPHY: CATH118249

## 2019-07-14 LAB — GLUCOSE, CAPILLARY: Glucose-Capillary: 104 mg/dL — ABNORMAL HIGH (ref 70–99)

## 2019-07-14 SURGERY — LEFT HEART CATH AND CORONARY ANGIOGRAPHY
Anesthesia: LOCAL

## 2019-07-14 MED ORDER — SODIUM CHLORIDE 0.9% FLUSH: 3.0000 mL | Freq: Two times a day (BID) | INTRAVENOUS | Status: DC

## 2019-07-14 MED ORDER — ASPIRIN 81 MG PO CHEW: 81.0000 mg | CHEWABLE_TABLET | ORAL | Status: DC

## 2019-07-14 MED ORDER — MIDAZOLAM HCL 2 MG/2ML IJ SOLN
INTRAMUSCULAR | Status: DC | PRN
  Administered 2019-07-14: 1 mg via INTRAVENOUS

## 2019-07-14 MED ORDER — MORPHINE SULFATE (PF) 2 MG/ML IV SOLN: 2.0000 mg | INTRAVENOUS | Status: DC | PRN

## 2019-07-14 MED ORDER — SODIUM CHLORIDE 0.9% FLUSH: 3.0000 mL | INTRAVENOUS | Status: DC | PRN

## 2019-07-14 MED ORDER — HYDRALAZINE HCL 20 MG/ML IJ SOLN
INTRAMUSCULAR | Status: AC
  Filled 2019-07-14: qty 1

## 2019-07-14 MED ORDER — SODIUM CHLORIDE 0.9 % WEIGHT BASED INFUSION: 1.0000 mL/kg/h | INTRAVENOUS | Status: DC

## 2019-07-14 MED ORDER — VERAPAMIL HCL 2.5 MG/ML IV SOLN
INTRAVENOUS | Status: AC
  Filled 2019-07-14: qty 2

## 2019-07-14 MED ORDER — IOHEXOL 350 MG/ML SOLN
INTRAVENOUS | Status: DC | PRN
  Administered 2019-07-14: 65 mL

## 2019-07-14 MED ORDER — HYDRALAZINE HCL 20 MG/ML IJ SOLN: 10.0000 mg | INTRAMUSCULAR | Status: DC | PRN

## 2019-07-14 MED ORDER — HEPARIN (PORCINE) IN NACL 1000-0.9 UT/500ML-% IV SOLN
INTRAVENOUS | Status: AC
  Filled 2019-07-14: qty 1000

## 2019-07-14 MED ORDER — SODIUM CHLORIDE 0.9 % WEIGHT BASED INFUSION
3.0000 mL/kg/h | INTRAVENOUS | Status: AC
  Administered 2019-07-14: 3 mL/kg/h via INTRAVENOUS

## 2019-07-14 MED ORDER — ATORVASTATIN CALCIUM 80 MG PO TABS: 80.0000 mg | ORAL_TABLET | Freq: Every day | ORAL | Status: DC

## 2019-07-14 MED ORDER — HYDRALAZINE HCL 20 MG/ML IJ SOLN
INTRAMUSCULAR | Status: DC | PRN
  Administered 2019-07-14: 10 mg via INTRAVENOUS

## 2019-07-14 MED ORDER — LABETALOL HCL 5 MG/ML IV SOLN: 10.0000 mg | INTRAVENOUS | Status: DC | PRN

## 2019-07-14 MED ORDER — LIDOCAINE HCL (PF) 1 % IJ SOLN
INTRAMUSCULAR | Status: DC | PRN
  Administered 2019-07-14: 2 mL via INTRADERMAL

## 2019-07-14 MED ORDER — LIDOCAINE HCL (PF) 1 % IJ SOLN
INTRAMUSCULAR | Status: AC
  Filled 2019-07-14: qty 30

## 2019-07-14 MED ORDER — FENTANYL CITRATE (PF) 100 MCG/2ML IJ SOLN
INTRAMUSCULAR | Status: AC
  Filled 2019-07-14: qty 2

## 2019-07-14 MED ORDER — ONDANSETRON HCL 4 MG/2ML IJ SOLN: 4.0000 mg | Freq: Four times a day (QID) | INTRAMUSCULAR | Status: DC | PRN

## 2019-07-14 MED ORDER — SODIUM CHLORIDE 0.9 % IV SOLN: 250.0000 mL | INTRAVENOUS | Status: DC | PRN

## 2019-07-14 MED ORDER — MIDAZOLAM HCL 2 MG/2ML IJ SOLN
INTRAMUSCULAR | Status: AC
  Filled 2019-07-14: qty 2

## 2019-07-14 MED ORDER — FENTANYL CITRATE (PF) 100 MCG/2ML IJ SOLN
INTRAMUSCULAR | Status: DC | PRN
  Administered 2019-07-14: 25 ug via INTRAVENOUS

## 2019-07-14 MED ORDER — ASPIRIN 81 MG PO CHEW: 81.0000 mg | CHEWABLE_TABLET | Freq: Every day | ORAL | Status: DC

## 2019-07-14 MED ORDER — SODIUM CHLORIDE 0.9 % IV SOLN: INTRAVENOUS | Status: AC

## 2019-07-14 MED ORDER — HEPARIN SODIUM (PORCINE) 1000 UNIT/ML IJ SOLN
INTRAMUSCULAR | Status: DC | PRN
  Administered 2019-07-14: 4000 [IU] via INTRAVENOUS

## 2019-07-14 MED ORDER — ACETAMINOPHEN 325 MG PO TABS: 650.0000 mg | ORAL_TABLET | ORAL | Status: DC | PRN

## 2019-07-14 MED ORDER — NITROGLYCERIN 1 MG/10 ML FOR IR/CATH LAB
INTRA_ARTERIAL | Status: AC
  Filled 2019-07-14: qty 10

## 2019-07-14 MED ORDER — VERAPAMIL HCL 2.5 MG/ML IV SOLN
INTRA_ARTERIAL | Status: DC | PRN
  Administered 2019-07-14: 5 mL via INTRA_ARTERIAL

## 2019-07-14 SURGICAL SUPPLY — 12 items
CATH INFINITI 5FR ANG PIGTAIL (CATHETERS) ×2 IMPLANT
CATH OPTITORQUE TIG 4.0 5F (CATHETERS) ×2 IMPLANT
DEVICE RAD COMP TR BAND LRG (VASCULAR PRODUCTS) ×2 IMPLANT
GLIDESHEATH SLEND A-KIT 6F 22G (SHEATH) ×2 IMPLANT
GUIDEWIRE INQWIRE 1.5J.035X260 (WIRE) ×1 IMPLANT
INQWIRE 1.5J .035X260CM (WIRE) ×2
KIT HEART LEFT (KITS) ×2 IMPLANT
PACK CARDIAC CATHETERIZATION (CUSTOM PROCEDURE TRAY) ×2 IMPLANT
SYR MEDRAD MARK 7 150ML (SYRINGE) ×2 IMPLANT
TRANSDUCER W/STOPCOCK (MISCELLANEOUS) ×2 IMPLANT
TUBING CIL FLEX 10 FLL-RA (TUBING) ×2 IMPLANT
WIRE HI TORQ VERSACORE-J 145CM (WIRE) ×2 IMPLANT

## 2019-07-14 NOTE — Interval H&P Note (Signed)
Cath Lab Visit (complete for each Cath Lab visit)  Clinical Evaluation Leading to the Procedure:   ACS: No.  Non-ACS:    Anginal Classification: CCS II  Anti-ischemic medical therapy: Minimal Therapy (1 class of medications)  Non-Invasive Test Results: No non-invasive testing performed  Prior CABG: No previous CABG      History and Physical Interval Note:  07/14/2019 1:26 PM  David Harding  has presented today for surgery, with the diagnosis of angina.  The various methods of treatment have been discussed with the patient and family. After consideration of risks, benefits and other options for treatment, the patient has consented to  Procedure(s): LEFT HEART CATH AND CORONARY ANGIOGRAPHY (N/A) as a surgical intervention.  The patient's history has been reviewed, patient examined, no change in status, stable for surgery.  I have reviewed the patient's chart and labs.  Questions were answered to the patient's satisfaction.     Quay Burow

## 2019-07-14 NOTE — Progress Notes (Signed)
Dr Gwenlyn Found states pt can go back to work Friday without restrictions

## 2019-07-14 NOTE — Discharge Instructions (Signed)
Radial Site Care ° °This sheet gives you information about how to care for yourself after your procedure. Your health care provider may also give you more specific instructions. If you have problems or questions, contact your health care provider. °What can I expect after the procedure? °After the procedure, it is common to have: °· Bruising and tenderness at the catheter insertion area. °Follow these instructions at home: °Medicines °· Take over-the-counter and prescription medicines only as told by your health care provider. °Insertion site care °· Follow instructions from your health care provider about how to take care of your insertion site. Make sure you: °? Wash your hands with soap and water before you change your bandage (dressing). If soap and water are not available, use hand sanitizer. °? Change your dressing as told by your health care provider. °? Leave stitches (sutures), skin glue, or adhesive strips in place. These skin closures may need to stay in place for 2 weeks or longer. If adhesive strip edges start to loosen and curl up, you may trim the loose edges. Do not remove adhesive strips completely unless your health care provider tells you to do that. °· Check your insertion site every day for signs of infection. Check for: °? Redness, swelling, or pain. °? Fluid or blood. °? Pus or a bad smell. °? Warmth. °· Do not take baths, swim, or use a hot tub until your health care provider approves. °· You may shower 24-48 hours after the procedure, or as directed by your health care provider. °? Remove the dressing and gently wash the site with plain soap and water. °? Pat the area dry with a clean towel. °? Do not rub the site. That could cause bleeding. °· Do not apply powder or lotion to the site. °Activity ° °· For 24 hours after the procedure, or as directed by your health care provider: °? Do not flex or bend the affected arm. °? Do not push or pull heavy objects with the affected arm. °? Do not  drive yourself home from the hospital or clinic. You may drive 24 hours after the procedure unless your health care provider tells you not to. °? Do not operate machinery or power tools. °· Do not lift anything that is heavier than 10 lb (4.5 kg), or the limit that you are told, until your health care provider says that it is safe. °· Ask your health care provider when it is okay to: °? Return to work or school. °? Resume usual physical activities or sports. °? Resume sexual activity. °General instructions °· If the catheter site starts to bleed, raise your arm and put firm pressure on the site. If the bleeding does not stop, get help right away. This is a medical emergency. °· If you went home on the same day as your procedure, a responsible adult should be with you for the first 24 hours after you arrive home. °· Keep all follow-up visits as told by your health care provider. This is important. °Contact a health care provider if: °· You have a fever. °· You have redness, swelling, or yellow drainage around your insertion site. °Get help right away if: °· You have unusual pain at the radial site. °· The catheter insertion area swells very fast. °· The insertion area is bleeding, and the bleeding does not stop when you hold steady pressure on the area. °· Your arm or hand becomes pale, cool, tingly, or numb. °These symptoms may represent a serious problem   that is an emergency. Do not wait to see if the symptoms will go away. Get medical help right away. Call your local emergency services (911 in the [place] Do not drive yourself to the hospital. °Summary °· After the procedure, it is common to have bruising and tenderness at the site. °· Follow instructions from your health care provider about how to take care of your radial site wound. Check the wound every day for signs of infection. °· Do not lift anything that is heavier than 10 lb (4.5 kg), or the limit that you are told, until your health care provider says  that it is safe. °This information is not intended to replace advice given to you by your health care provider. Make sure you discuss any questions you have with your health care provider. °Document Released: 08/26/2010 Document Revised: 08/29/2017 Document Reviewed: 08/29/2017 °Elsevier Patient Education © 2020 [employer] ° °

## 2019-07-15 MED FILL — Heparin Sod (Porcine)-NaCl IV Soln 1000 Unit/500ML-0.9%: INTRAVENOUS | Qty: 1000 | Status: AC

## 2019-07-24 ENCOUNTER — Encounter: Payer: Self-pay | Admitting: Gastroenterology

## 2019-08-12 ENCOUNTER — Other Ambulatory Visit: Payer: Self-pay

## 2019-08-12 ENCOUNTER — Ambulatory Visit (INDEPENDENT_AMBULATORY_CARE_PROVIDER_SITE_OTHER): Payer: BLUE CROSS/BLUE SHIELD | Admitting: Cardiology

## 2019-08-12 ENCOUNTER — Encounter: Payer: Self-pay | Admitting: Cardiology

## 2019-08-12 VITALS — BP 102/80 | HR 67 | Ht 69.0 in | Wt 189.0 lb

## 2019-08-12 DIAGNOSIS — E1165 Type 2 diabetes mellitus with hyperglycemia: Secondary | ICD-10-CM | POA: Diagnosis not present

## 2019-08-12 DIAGNOSIS — I10 Essential (primary) hypertension: Secondary | ICD-10-CM | POA: Diagnosis not present

## 2019-08-12 DIAGNOSIS — E785 Hyperlipidemia, unspecified: Secondary | ICD-10-CM

## 2019-08-12 DIAGNOSIS — I2510 Atherosclerotic heart disease of native coronary artery without angina pectoris: Secondary | ICD-10-CM | POA: Diagnosis not present

## 2019-08-12 DIAGNOSIS — Z720 Tobacco use: Secondary | ICD-10-CM

## 2019-08-12 LAB — LIPID PANEL
Chol/HDL Ratio: 5 ratio (ref 0.0–5.0)
Cholesterol, Total: 174 mg/dL (ref 100–199)
HDL: 35 mg/dL — ABNORMAL LOW (ref >39)
LDL Chol Calc (NIH): 85 mg/dL (ref 0–99)
Triglycerides: 332 mg/dL — ABNORMAL HIGH (ref 0–149)
VLDL Cholesterol Cal: 54 mg/dL — ABNORMAL HIGH (ref 5–40)

## 2019-08-12 LAB — BASIC METABOLIC PANEL
BUN/Creatinine Ratio: 8 — ABNORMAL LOW (ref 10–24)
BUN: 7 mg/dL — ABNORMAL LOW (ref 8–27)
CO2: 26 mmol/L (ref 20–29)
Calcium: 9.2 mg/dL (ref 8.6–10.2)
Chloride: 99 mmol/L (ref 96–106)
Creatinine, Ser: 0.92 mg/dL (ref 0.76–1.27)
GFR calc Af Amer: 104 mL/min/{1.73_m2} (ref >59)
GFR calc non Af Amer: 90 mL/min/{1.73_m2} (ref >59)
Glucose: 119 mg/dL — ABNORMAL HIGH (ref 65–99)
Potassium: 4.7 mmol/L (ref 3.5–5.2)
Sodium: 140 mmol/L (ref 134–144)

## 2019-08-12 LAB — HEPATIC FUNCTION PANEL
ALT: 112 IU/L — ABNORMAL HIGH (ref 0–44)
AST: 132 IU/L — ABNORMAL HIGH (ref 0–40)
Albumin: 4.7 g/dL (ref 3.8–4.9)
Alkaline Phosphatase: 97 IU/L (ref 39–117)
Bilirubin Total: 0.3 mg/dL (ref 0.0–1.2)
Bilirubin, Direct: 0.11 mg/dL (ref 0.00–0.40)
Total Protein: 7.5 g/dL (ref 6.0–8.5)

## 2019-08-12 NOTE — Addendum Note (Signed)
Addended by: Beckey Rutter on: 08/12/2019 10:22 AM   Modules accepted: Orders

## 2019-08-12 NOTE — Patient Instructions (Signed)
Medication Instructions:  Your physician recommends that you continue on your current medications as directed. Please refer to the Current Medication list given to you today.  *If you need a refill on your cardiac medications before your next appointment, please call your pharmacy*  Lab Work: Your physician recommends that you have a BMP, hepatic and lipid drawn  If you have labs (blood work) drawn today and your tests are completely normal, you will receive your results only by: Marland Kitchen MyChart Message (if you have MyChart) OR . A paper copy in the mail If you have any lab test that is abnormal or we need to change your treatment, we will call you to review the results.  Testing/Procedures: NONE  Follow-Up: At [facility], you and your health needs are our priority.  As part of our continuing mission to provide you with exceptional heart care, we have created designated Provider Care Teams.  These Care Teams include your primary Cardiologist (physician) and Advanced Practice Providers (APPs -  Physician Assistants and Nurse Practitioners) who all work together to provide you with the care you need, when you need it.  Your next appointment:   6 month(s)  The format for your next appointment:   In Person  Provider:   [name], MD

## 2019-08-12 NOTE — Progress Notes (Signed)
Cardiology Office Note:    Date:  08/12/2019   ID:  David Harding, DOB 1961-04-08, MRN [id number]  PCP:  Myrlene Broker, MD  Cardiologist:  Jenean Lindau, MD   Referring MD: Myrlene Broker, MD    ASSESSMENT:    1. Atherosclerosis of native coronary artery of native heart without angina pectoris   2. Essential hypertension   3. Type 2 diabetes mellitus with hyperglycemia, without long-term current use of insulin ([place])   4. Dyslipidemia   5. Tobacco abuse    PLAN:    In order of problems listed above:  1. Coronary artery disease: Secondary prevention stressed with the patient.  Importance of compliance with diet and medication stressed and he vocalized understanding.  His exercise program is excellent and he will keep it that way. 2. Essential hypertension: Blood pressure is stable 3. Mixed dyslipidemia: Diet was discussed and he will have a lipid check today as he is fasting.  He also promises never to go back to smoking. 4. Patient will be seen in follow-up appointment in 6 months or earlier if the patient has any concerns    Medication Adjustments/Labs and Tests Ordered: Current medicines are reviewed at length with the patient today.  Concerns regarding medicines are outlined above.  No orders of the defined types were placed in this encounter.  No orders of the defined types were placed in this encounter.    Chief Complaint  Patient presents with  . Follow-up    1 month follow up post cath. procedure     History of Present Illness:    David Harding is a 59 y.o. male.  Patient has past medical history of coronary artery disease post stenting and mentions to me that his coronary angiography revealed nonobstructive disease.  I reviewed the report in detail with him.  Fortunately he has quit smoking.  He denies any chest pain orthopnea or PND and tells me that he walks the dog half an hour on a daily basis.  At the time of my evaluation, the patient is  alert awake oriented and in no distress.  Past Medical History:  Diagnosis Date  . Acquired hypothyroidism 07/23/2017  . Acute bilateral ankle pain 03/20/2017  . Annual physical exam 07/23/2017  . Anxiety   . Chronic insomnia 03/08/2018  . Coronary atherosclerosis of native coronary artery 07/05/2016  . Dyslipidemia 07/05/2016  . Elevated cholesterol   . Erectile dysfunction 07/23/2017  . Essential hypertension 07/05/2016  . GERD without esophagitis 03/08/2018  . Heel pain, bilateral 10/31/2017  . History of participation in smoking cessation counseling 05/30/2017  . Hypertension   . Lumbar back pain 03/08/2018  . Pneumonia   . Pre-diabetes   . Primary insomnia 10/17/2018  . Right knee pain 03/08/2018  . Tobacco abuse 10/17/2018  . Type 2 diabetes mellitus with hyperglycemia, without long-term current use of insulin ([place]) 07/23/2017    Past Surgical History:  Procedure Laterality Date  . CORONARY ANGIOPLASTY WITH STENT PLACEMENT  12/29/2011  . LEFT HEART CATH AND CORONARY ANGIOGRAPHY N/A 07/14/2019   Procedure: LEFT HEART CATH AND CORONARY ANGIOGRAPHY;  Surgeon: Lorretta Harp, MD;  Location: [facility];  Service: Cardiovascular;  Laterality: N/A;  . stitches to left hand      Current Medications: Current Meds  Medication Sig  . ALPRAZolam (XANAX) 1 MG tablet Take 1 mg by mouth at bedtime as needed for sleep.   Marland Kitchen aspirin EC 81 MG tablet Take  81 mg by mouth daily.   . clopidogrel (PLAVIX) 75 MG tablet TAKE 1 TABLET BY MOUTH DAILY  . empagliflozin (JARDIANCE) 10 MG TABS tablet Take 10 mg by mouth every morning.   Marland Kitchen HYDROcodone-acetaminophen (NORCO/VICODIN) 5-325 MG tablet Take 1 tablet by mouth every 6 (six) hours as needed for pain. For pain   . isosorbide mononitrate (IMDUR) 60 MG 24 hr tablet Take 60 mg by mouth 2 (two) times daily.   Marland Kitchen levothyroxine (SYNTHROID) 125 MCG tablet Take 125 mcg by mouth daily before breakfast.   . metFORMIN (GLUCOPHAGE) 1000 MG tablet Take  1,000 mg by mouth 2 (two) times daily.  . metoprolol tartrate (LOPRESSOR) 25 MG tablet Take 25 mg by mouth 2 (two) times daily.  . nitroGLYCERIN (NITROSTAT) 0.4 MG SL tablet Place 0.4 mg under the tongue every 5 (five) minutes as needed for chest pain.   Marland Kitchen omeprazole (PRILOSEC) 20 MG capsule Take 20 mg by mouth every morning. For heart burn  . pravastatin (PRAVACHOL) 80 MG tablet Take 80 mg by mouth daily.  . sildenafil (VIAGRA) 100 MG tablet TAKE 1 TABLET BY MOUTH EVERY DAY AS NEEDED  . valsartan-hydrochlorothiazide (DIOVAN-HCT) 80-12.5 MG tablet Take 1 tablet by mouth daily.     Allergies:   Patient has no known allergies.   Social History   Socioeconomic History  . Marital status: Single    Spouse name: Not on file  . Number of children: Not on file  . Years of education: Not on file  . Highest education level: Not on file  Occupational History  . Occupation: maintenance  Tobacco Use  . Smoking status: Former Smoker    Packs/day: 0.50  . Smokeless tobacco: Never Used  Substance and Sexual Activity  . Alcohol use: Yes    Alcohol/week: 1.0 - 6.0 standard drinks    Types: 1 - 6 Cans of beer per week  . Drug use: Not on file  . Sexual activity: Not on file  Other Topics Concern  . Not on file  Social History Narrative  . Not on file   Social Determinants of Health   Financial Resource Strain:   . Difficulty of Paying Living Expenses: Not on file  Food Insecurity:   . Worried About Charity fundraiser in the Last Year: Not on file  . Ran Out of Food in the Last Year: Not on file  Transportation Needs:   . Lack of Transportation (Medical): Not on file  . Lack of Transportation (Non-Medical): Not on file  Physical Activity:   . Days of Exercise per Week: Not on file  . Minutes of Exercise per Session: Not on file  Stress:   . Feeling of Stress : Not on file  Social Connections:   . Frequency of Communication with Friends and Family: Not on file  . Frequency of Social  Gatherings with Friends and Family: Not on file  . Attends Religious Services: Not on file  . Active Member of Clubs or Organizations: Not on file  . Attends Archivist Meetings: Not on file  . Marital Status: Not on file     Family History: The patient's family history includes Heart disease in his father; Kidney cancer in his father.  ROS:   Please see the history of present illness.    All other systems reviewed and are negative.  EKGs/Labs/Other Studies Reviewed:    The following studies were reviewed today: Lorretta Harp, MD Study date: 07/14/19  MyChart  Results Release  MyChart Status: Pending Results Release  Physicians  Panel Physicians Referring Physician Case Authorizing Physician  Lorretta Harp, MD (Primary)    Procedures  LEFT HEART CATH AND CORONARY ANGIOGRAPHY  Conclusion    Previously placed Mid LAD stent (unknown type) is widely patent.  The left ventricular systolic function is normal.  LV end diastolic pressure is normal.  The left ventricular ejection fraction is 55-65% by visual estimate.   David Harding is a 59 y.o. male    [id number] LOCATION:  FACILITY: [facility]  PHYSICIAN: Quay Burow, M.D. 08/13/1960   DATE OF PROCEDURE:  07/14/2019      Recent Labs: 07/08/2019: Hemoglobin 15.3; Platelets 122 07/11/2019: BUN 7; Creatinine, Ser 0.79; Potassium 4.4; Sodium 139  Recent Lipid Panel    Component Value Date/Time   CHOL  05/03/2010 0503    189        ATP III CLASSIFICATION:  <200     mg/dL   Desirable  200-239  mg/dL   Borderline High  >=240    mg/dL   High          TRIG 101 05/03/2010 0503   HDL 44 05/03/2010 0503   CHOLHDL 4.3 05/03/2010 0503   VLDL 20 05/03/2010 0503   LDLCALC (H) 05/03/2010 0503    125        Total Cholesterol/HDL:CHD Risk Coronary Heart Disease Risk Table                     Men   Women  1/2 Average Risk   3.4   3.3  Average Risk       5.0   4.4  2 X Average Risk   9.6   7.1  3  X Average Risk  23.4   11.0        Use the calculated Patient Ratio above and the CHD Risk Table to determine the patient's CHD Risk.        ATP III CLASSIFICATION (LDL):  <100     mg/dL   Optimal  100-129  mg/dL   Near or Above                    Optimal  130-159  mg/dL   Borderline  160-189  mg/dL   High  >190     mg/dL   Very High    Physical Exam:    VS:  BP 102/80   Pulse 67   Ht 5\' 9"  (1.753 m)   Wt 189 lb (85.7 kg)   SpO2 93%   BMI 27.91 kg/m     Wt Readings from Last 3 Encounters:  08/12/19 189 lb (85.7 kg)  07/14/19 180 lb (81.6 kg)  07/08/19 187 lb (84.8 kg)     GEN: Patient is in no acute distress HEENT: Normal NECK: No JVD; No carotid bruits LYMPHATICS: No lymphadenopathy CARDIAC: Hear sounds regular, 2/6 systolic murmur at the apex. RESPIRATORY:  Clear to auscultation without rales, wheezing or rhonchi  ABDOMEN: Soft, non-tender, non-distended MUSCULOSKELETAL:  No edema; No deformity  SKIN: Warm and dry NEUROLOGIC:  Alert and oriented x 3 PSYCHIATRIC:  Normal affect   Signed, Jenean Lindau, MD  08/12/2019 9:46 AM    [place]

## 2019-10-21 ENCOUNTER — Encounter: Payer: Self-pay | Admitting: Nurse Practitioner

## 2019-10-21 ENCOUNTER — Ambulatory Visit (INDEPENDENT_AMBULATORY_CARE_PROVIDER_SITE_OTHER): Payer: BC Managed Care – PPO | Admitting: Nurse Practitioner

## 2019-10-21 ENCOUNTER — Other Ambulatory Visit: Payer: Self-pay

## 2019-10-21 ENCOUNTER — Telehealth: Payer: Self-pay

## 2019-10-21 VITALS — BP 102/60 | HR 62 | Temp 97.7°F | Ht 69.0 in | Wt 189.0 lb

## 2019-10-21 DIAGNOSIS — R195 Other fecal abnormalities: Secondary | ICD-10-CM | POA: Diagnosis not present

## 2019-10-21 DIAGNOSIS — Z01818 Encounter for other preprocedural examination: Secondary | ICD-10-CM | POA: Diagnosis not present

## 2019-10-21 NOTE — Progress Notes (Signed)
IMPRESSION and PLAN:    David Harding is a 59 y.o. male with a pmh significant for, not necessarily limited to diabetes, CAD status post stent in 2013, hypertension, hyperlipidemia, history of tobacco abuse   # Positive Cologuard.  --Patient will be scheduled for colonoscopy.  This is actually a reschedule from December. The risks and benefits of colonoscopy with possible polypectomy / biopsies were discussed and the patient agrees to proceed.    # CAD/remote stent    --Cardiac cath in December showed no significant CAD. --On chronic Plavix/aspirin --Plavix will be held 5 days prior to colonoscopy. Will instruct when and how to resume after procedure. Patient understands that there is a low but real risk of cardiovascular event such as heart attack, stroke, or embolism /  thrombosis, or ischemia while off Plavix. The patient consents to proceed. Will communicate by phone or EMR with patient's prescribing provider to confirm that holding Plavix is reasonable in this case.    # Long-term use of PPIs --denies history of GERD.  Says Prilosec was started many years ago for chest pain and the medication was never discontinued.  No history of PUD and no history of GI bleed --Maybe he does not need a PPI any longer.  He will decrease dose to every other day for 2 weeks then discontinue it. --He will resume Prilosec for any GERD symptoms occurring after discontinuation of PPi  HPI:    Primary GI: Dr. Lyndel Safe  Chief complaint : Positive Cologuard  **History comes from the chart and patient  I saw patient mid November 2020 for evaluation of a positive Cologuard.  He was scheduled for colonoscopy to be done while Plavix was on hold.  Patient canceled the procedure due to headaches and chest pain.  There was concern for cardiac etiology of symptoms and patient underwent cardiac catheterization early December 2020.  There was no significant CAD, his LAD stent was widely patent.   Turns out, there was a problem with 1 of patient's teeth.  After extraction of the tooth all of his symptoms subsided.  Patient is here to get rescheduled for colonoscopy.  He has no GI complaints.  Patient has been on Prilosec for many years.  He denies history of GERD but says it was started for chest pain and he is just continue to take it.  Review of systems:     No chest pain, no SOB, no fevers, no urinary sx   Past Medical History:  Diagnosis Date  . Acquired hypothyroidism 07/23/2017  . Acute bilateral ankle pain 03/20/2017  . Annual physical exam 07/23/2017  . Anxiety   . Chronic insomnia 03/08/2018  . Coronary atherosclerosis of native coronary artery 07/05/2016  . Dyslipidemia 07/05/2016  . Elevated cholesterol   . Erectile dysfunction 07/23/2017  . Essential hypertension 07/05/2016  . GERD without esophagitis 03/08/2018  . Heel pain, bilateral 10/31/2017  . History of participation in smoking cessation counseling 05/30/2017  . Hypertension   . Lumbar back pain 03/08/2018  . Pneumonia   . Pre-diabetes   . Primary insomnia 10/17/2018  . Right knee pain 03/08/2018  . Tobacco abuse 10/17/2018  . Type 2 diabetes mellitus with hyperglycemia, without long-term current use of insulin ([place]) 07/23/2017    Patient's surgical history, family medical history, social history, medications and allergies were all reviewed in Epic   Creatinine clearance cannot be calculated (Patient's most recent lab result is older than the  maximum 21 days allowed.)  Current Outpatient Medications  Medication Sig Dispense Refill  . ALPRAZolam (XANAX) 1 MG tablet Take 1 mg by mouth at bedtime as needed for sleep.     Marland Kitchen aspirin EC 81 MG tablet Take 81 mg by mouth daily.     . clopidogrel (PLAVIX) 75 MG tablet TAKE 1 TABLET BY MOUTH DAILY    . empagliflozin (JARDIANCE) 10 MG TABS tablet Take 10 mg by mouth every morning.     Marland Kitchen HYDROcodone-acetaminophen (NORCO/VICODIN) 5-325 MG tablet Take 1 tablet by mouth every  6 (six) hours as needed for pain. For pain     . isosorbide mononitrate (IMDUR) 60 MG 24 hr tablet Take 60 mg by mouth 2 (two) times daily.     Marland Kitchen levothyroxine (SYNTHROID) 125 MCG tablet Take 125 mcg by mouth daily before breakfast.     . metFORMIN (GLUCOPHAGE) 1000 MG tablet Take 1,000 mg by mouth 2 (two) times daily.    . metoprolol tartrate (LOPRESSOR) 25 MG tablet Take 25 mg by mouth 2 (two) times daily.    . nitroGLYCERIN (NITROSTAT) 0.4 MG SL tablet Place 0.4 mg under the tongue every 5 (five) minutes as needed for chest pain.     Marland Kitchen omeprazole (PRILOSEC) 20 MG capsule Take 20 mg by mouth every morning. For heart burn    . sildenafil (VIAGRA) 100 MG tablet TAKE 1 TABLET BY MOUTH EVERY DAY AS NEEDED    . valsartan-hydrochlorothiazide (DIOVAN-HCT) 80-12.5 MG tablet Take 1 tablet by mouth daily.     No current facility-administered medications for this visit.    Physical Exam:     BP 102/60   Pulse 62   Temp 97.7 F (36.5 C)   Ht 5\' 9"  (1.753 m)   Wt 189 lb (85.7 kg)   BMI 27.91 kg/m   GENERAL:  Pleasant male in NAD PSYCH: : Cooperative, normal affect CARDIAC:  RRR, murmur heard, no peripheral edema PULM: Normal respiratory effort, lungs CTA bilaterally, no wheezing ABDOMEN:  Nondistended, soft, nontender. No obvious masses, no hepatomegaly,  normal bowel sounds SKIN:  turgor, no lesions seen Musculoskeletal:  Normal muscle tone, normal strength NEURO: Alert and oriented x 3, no focal neurologic deficits   Tye Savoy , NP 10/21/2019, 10:39 AM

## 2019-10-21 NOTE — Telephone Encounter (Signed)
   Primary Cardiologist: Jenean Lindau, MD  Chart reviewed as part of pre-operative protocol coverage.   Hx of CAD s/p  mid LAD stent in 2017. Most recent cath showed  widely patent stent 07/2019. Dr. Geraldo Pitter, can patient hold Plavix? Please forward your response to [facility] PREOP.   Thank you   Leanor Kail, PA 10/21/2019, 12:03 PM

## 2019-10-21 NOTE — Patient Instructions (Signed)
If you are age 61 or older, your body mass index should be between 23-30. Your Body mass index is 27.91 kg/m. If this is out of the aforementioned range listed, please consider follow up with your Primary Care Provider.  If you are age 44 or younger, your body mass index should be between 19-25. Your Body mass index is 27.91 kg/m. If this is out of the aformentioned range listed, please consider follow up with your Primary Care Provider.   You have been scheduled for a colonoscopy. Please follow written instructions given to you at your visit today.  Please pick up your prep supplies at the pharmacy within the next 1-3 days. If you use inhalers (even only as needed), please bring them with you on the day of your procedure. Your physician has requested that you go to www.startemmi.com and enter the access code given to you at your visit today. This web site gives a general overview about your procedure. However, you should still follow specific instructions given to you by our office regarding your preparation for the procedure.  You already have Suprep prep.  You will be contacted by our office prior to your procedure for directions on holding your Plavix.  If you do not hear from our office 1 week prior to your scheduled procedure, please call [phone] to discuss.   DECREASE Prilosec to every other day for two weeks then STOP.  If GERD symptoms recur, then resume.  Thank you for choosing me and [facility].   Tye Savoy, NP

## 2019-10-21 NOTE — Telephone Encounter (Signed)
Yes. If stent is more than a year old, can hold plavis. Continue uninterrupted ecasa 81mg  please

## 2019-10-21 NOTE — Telephone Encounter (Signed)
[facility] Pre-operative Risk Assessment     Request for surgical clearance:     Endoscopy Procedure  What type of surgery is being performed?     Colonoscopy  When is this surgery scheduled?     10/31/19  What type of clearance is required ?   Pharmacy  Are there any medications that need to be held prior to surgery and how long? HOLD PLAVIX FIVE DAYS PRIOR  Practice name and name of physician performing surgery?      [facility]/ Dr. Lyndel Safe  What is your office phone and fax number?      Phone- [phone]  Fax- [phone] Attn: Peter Congo, RMA  Anesthesia type (None, local, MAC, general) ?       MAC

## 2019-10-23 NOTE — Telephone Encounter (Signed)
Spoke with patient this morning.  He was advised to HOLD his Plavix five days prior to his procedure per Dr. Geraldo Pitter. Pt. Verbalized understanding.

## 2019-10-29 ENCOUNTER — Other Ambulatory Visit: Payer: Self-pay | Admitting: Gastroenterology

## 2019-10-29 ENCOUNTER — Ambulatory Visit (INDEPENDENT_AMBULATORY_CARE_PROVIDER_SITE_OTHER): Payer: BC Managed Care – PPO

## 2019-10-29 DIAGNOSIS — Z1159 Encounter for screening for other viral diseases: Secondary | ICD-10-CM

## 2019-10-29 LAB — SARS CORONAVIRUS 2 (TAT 6-24 HRS): SARS Coronavirus 2: NEGATIVE

## 2019-10-31 ENCOUNTER — Ambulatory Visit (AMBULATORY_SURGERY_CENTER): Payer: BC Managed Care – PPO | Admitting: Gastroenterology

## 2019-10-31 ENCOUNTER — Encounter: Payer: Self-pay | Admitting: Gastroenterology

## 2019-10-31 ENCOUNTER — Other Ambulatory Visit: Payer: Self-pay

## 2019-10-31 VITALS — BP 182/100 | HR 65 | Temp 96.6°F | Resp 22 | Ht 69.0 in | Wt 189.0 lb

## 2019-10-31 DIAGNOSIS — R195 Other fecal abnormalities: Secondary | ICD-10-CM | POA: Diagnosis not present

## 2019-10-31 DIAGNOSIS — D123 Benign neoplasm of transverse colon: Secondary | ICD-10-CM

## 2019-10-31 MED ORDER — SODIUM CHLORIDE 0.9 % IV SOLN: 500.0000 mL | Freq: Once | INTRAVENOUS | Status: DC

## 2019-10-31 NOTE — Progress Notes (Signed)
A/ox3, pleased with MAC, report to RN 

## 2019-10-31 NOTE — Progress Notes (Signed)
Called to room to assist during endoscopic procedure.  Patient ID and intended procedure confirmed with present staff. Received instructions for my participation in the procedure from the performing physician.  

## 2019-10-31 NOTE — Op Note (Signed)
[facility] Patient Name: Coren Ryer Procedure Date: 10/31/2019 11:15 AM MRN: [id number] Endoscopist: Jackquline Denmark , MD Age: 59 Referring MD:  Date of Birth: 1960/09/07 Gender: Male Account #: [id number] Procedure:                Colonoscopy Indications:              Positive Cologuard test Medicines:                Monitored Anesthesia Care Procedure:                Pre-Anesthesia Assessment:                           - Prior to the procedure, a History and Physical                            was performed, and patient medications and                            allergies were reviewed. The patient's tolerance of                            previous anesthesia was also reviewed. The risks                            and benefits of the procedure and the sedation                            options and risks were discussed with the patient.                            All questions were answered, and informed consent                            was obtained. Prior Anticoagulants: Plavix was held                            for 5 days before.                           ASA Grade Assessment: II - A patient with mild                            systemic disease. After reviewing the risks and                            benefits, the patient was deemed in satisfactory                            condition to undergo the procedure.                           After obtaining informed consent, the colonoscope  was passed under direct vision. Throughout the                            procedure, the patient's blood pressure, pulse, and                            oxygen saturations were monitored continuously. The                            Colonoscope was introduced through the anus and                            advanced to the 2 cm into the ileum. The                            colonoscopy was performed without difficulty. The                            patient  tolerated the procedure well. The quality                            of the bowel preparation was good. The terminal                            ileum, ileocecal valve, appendiceal orifice, and                            rectum were photographed. Scope In: 11:32:18 AM Scope Out: 11:49:28 AM Scope Withdrawal Time: 0 hours 14 minutes 41 seconds  Total Procedure Duration: 0 hours 17 minutes 10 seconds  Findings:                 A 4 mm polyp was found in the proximal transverse                            colon. The polyp was sessile. The polyp was removed                            with a cold snare. Resection and retrieval were                            complete.                           A few small-mouthed diverticula were found in the                            sigmoid colon.                           Non-bleeding internal hemorrhoids were found during                            retroflexion. The hemorrhoids were small.  The terminal ileum appeared normal.                           The exam was otherwise without abnormality on                            direct and retroflexion views. Complications:            No immediate complications. Estimated Blood Loss:     Estimated blood loss: none. Impression:               - One 4 mm polyp in the proximal transverse colon,                            removed with a cold snare. Resected and retrieved.                           - Mild sigmoid diverticulosis.                           - Non-bleeding internal hemorrhoids.                           - Otherwise normal colonoscopy to TI. Recommendation:           - Patient has a contact number available for                            emergencies. The signs and symptoms of potential                            delayed complications were discussed with the                            patient. Return to normal activities tomorrow.                            Written discharge  instructions were provided to the                            patient.                           - Resume previous diet.                           - Continue present medications.                           - Await pathology results.                           - Resume Plavix from tomorrow onwards                           - Repeat colonoscopy for surveillance based on  pathology results.                           - Return to GI clinic in 12 weeks. Jackquline Denmark, MD 10/31/2019 11:54:29 AM This report has been signed electronically.

## 2019-10-31 NOTE — Patient Instructions (Signed)
HANDOUTS PROVIDED ON: POLYPS, DIVERTICULOSIS, & HEMORRHOIDS  The polyp removed today have been sent for pathology.  The results can take 1-3 weeks to receive.  When your next colonoscopy should occur will be based on the pathology results.    You may resume your previous diet.  You may resume all medications today EXCEPT Plavix which you may resume tomorrow 11/01/19.  Thank you for allowing [place] to care for you today!!!   YOU HAD AN ENDOSCOPIC PROCEDURE TODAY AT [facility]:   Refer to the procedure report that was given to you for any specific questions about what was found during the examination.  If the procedure report does not answer your questions, please call your gastroenterologist to clarify.  If you requested that your care partner not be given the details of your procedure findings, then the procedure report has been included in a sealed envelope for you to review at your convenience later.  YOU SHOULD EXPECT: Some feelings of bloating in the abdomen. Passage of more gas than usual.  Walking can help get rid of the air that was put into your GI tract during the procedure and reduce the bloating. If you had a lower endoscopy (such as a colonoscopy or flexible sigmoidoscopy) you may notice spotting of blood in your stool or on the toilet paper. If you underwent a bowel prep for your procedure, you may not have a normal bowel movement for a few days.  Please Note:  You might notice some irritation and congestion in your nose or some drainage.  This is from the oxygen used during your procedure.  There is no need for concern and it should clear up in a day or so.  SYMPTOMS TO REPORT IMMEDIATELY:   Following lower endoscopy (colonoscopy or flexible sigmoidoscopy):  Excessive amounts of blood in the stool  Significant tenderness or worsening of abdominal pains  Swelling of the abdomen that is new, acute  Fever of 100F or higher  For urgent or emergent issues, a  gastroenterologist can be reached at any hour by calling [phone]. Do not use MyChart messaging for urgent concerns.    DIET:  We do recommend a small meal at first, but then you may proceed to your regular diet.  Drink plenty of fluids but you should avoid alcoholic beverages for 24 hours.  ACTIVITY:  You should plan to take it easy for the rest of today and you should NOT DRIVE or use heavy machinery until tomorrow (because of the sedation medicines used during the test).    FOLLOW UP: Our staff will call the number listed on your records 48-72 hours following your procedure to check on you and address any questions or concerns that you may have regarding the information given to you following your procedure. If we do not reach you, we will leave a message.  We will attempt to reach you two times.  During this call, we will ask if you have developed any symptoms of COVID 19. If you develop any symptoms (ie: fever, flu-like symptoms, shortness of breath, cough etc.) before then, please call [phone].  If you test positive for Covid 19 in the 2 weeks post procedure, please call and report this information to [place].    If any biopsies were taken you will be contacted by phone or by letter within the next 1-3 weeks.  Please call us at [phone] if you have not heard about the biopsies in 3 weeks.  SIGNATURES/CONFIDENTIALITY: You and/or your care partner have signed paperwork which will be entered into your electronic medical record.  These signatures attest to the fact that that the information above on your After Visit Summary has been reviewed and is understood.  Full responsibility of the confidentiality of this discharge information lies with you and/or your care-partner.

## 2019-11-04 ENCOUNTER — Telehealth: Payer: Self-pay | Admitting: *Deleted

## 2019-11-04 ENCOUNTER — Telehealth: Payer: Self-pay

## 2019-11-04 NOTE — Telephone Encounter (Signed)
  Follow up Call-  Call back number 10/31/2019  Post procedure Call Back phone  # [phone]  Permission to leave phone message Yes  Some recent data might be hidden     Left messagge

## 2019-11-04 NOTE — Telephone Encounter (Signed)
Message left

## 2019-11-05 ENCOUNTER — Encounter: Payer: Self-pay | Admitting: Gastroenterology

## 2019-11-05 NOTE — Progress Notes (Signed)
Agree with excellent note RG

## 2022-10-29 NOTE — Progress Notes (Unsigned)
Cardiology Office Note:   Date:  10/30/2022  NAME:  David Harding    MRN: [id number] DOB:  03-04-1961   PCP:  Myrlene Broker, MD  Cardiologist:  Jenean Lindau, MD  Electrophysiologist:  None   Referring MD: Myrlene Broker, MD   Chief Complaint  Patient presents with   Follow-up        History of Present Illness:   David Harding is a 62 y.o. male with a hx of CAD, HLD, DM, HTN who is being seen today for the evaluation of CAD at the request of Myrlene Broker, MD. he reports for the past 3 to 4 months has had worsening chest discomfort with exertion.  He tells me it responds to nitroglycerin.  He reports heavy activity gets him short of breath and with chest discomfort.  Described as pressure.  It is predictable.  It is alleviated by rest.  He had similar symptoms in 2020.  He had a left heart catheterization that showed a patent LAD stent.  He has not been on any cholesterol-lowering medications.  His LDL is not at goal.  He is diabetic with an A1c of 6.8.  BP 136/78 in office today.  Blood pressure seems to be controlled at home.  He has remained on aspirin and Plavix.  He is on metoprolol tartrate 25 mg twice daily.  He is on Imdur 60 mg twice daily.  He does have a noticeable murmur on examination.  Suspect he has mild aortic stenosis.  No recent echo.  EKG in office demonstrates sinus rhythm with nonspecific ST-T changes.  He denies any fevers or chills.  He was seen in the emergency room on 06/03/2022.  Had similar symptoms.  Troponins were negative x 2.  Symptoms are ongoing.  Has not followed with cardiology in quite some time.  He is a former smoker.  No alcohol in excess.  No drug use is reported.  He works as a Architectural technologist.  He reports walking 5 to 6 miles per day.  Symptoms are not occurring with all activity but are noticeable with heavy activity.  Problem List CAD -PCI mLAD -LHC 2020 normal 2. HLD -T chol 236, TG 218, LDL 164, HDL 47 3. DM -A1c  6.8 4. HTN  Past Medical History: Past Medical History:  Diagnosis Date   Acquired hypothyroidism 07/23/2017   Acute bilateral ankle pain 03/20/2017   Annual physical exam 07/23/2017   Anxiety    Chronic insomnia 03/08/2018   Coronary atherosclerosis of native coronary artery 07/05/2016   Dyslipidemia 07/05/2016   Elevated cholesterol    Erectile dysfunction 07/23/2017   Essential hypertension 07/05/2016   GERD without esophagitis 03/08/2018   Heel pain, bilateral 10/31/2017   History of participation in smoking cessation counseling 05/30/2017   Hypertension    Lumbar back pain 03/08/2018   Pneumonia    Pre-diabetes    Primary insomnia 10/17/2018   Right knee pain 03/08/2018   Tobacco abuse 10/17/2018   Type 2 diabetes mellitus with hyperglycemia, without long-term current use of insulin ([place]) 07/23/2017    Past Surgical History: Past Surgical History:  Procedure Laterality Date   CORONARY ANGIOPLASTY WITH STENT PLACEMENT  12/29/2011   LEFT HEART CATH AND CORONARY ANGIOGRAPHY N/A 07/14/2019   Procedure: LEFT HEART CATH AND CORONARY ANGIOGRAPHY;  Surgeon: Lorretta Harp, MD;  Location: [facility];  Service: Cardiovascular;  Laterality: N/A;   stitches to left hand  Current Medications: Current Meds  Medication Sig   ALPRAZolam (XANAX) 1 MG tablet Take 1 mg by mouth at bedtime as needed for sleep.    aspirin EC 81 MG tablet Take 81 mg by mouth daily.    atorvastatin (LIPITOR) 80 MG tablet Take 1 tablet (80 mg total) by mouth daily.   clopidogrel (PLAVIX) 75 MG tablet TAKE 1 TABLET BY MOUTH DAILY   empagliflozin (JARDIANCE) 10 MG TABS tablet Take 10 mg by mouth every morning.    HYDROcodone-acetaminophen (NORCO/VICODIN) 5-325 MG tablet Take 1 tablet by mouth every 6 (six) hours as needed for pain. For pain    isosorbide mononitrate (IMDUR) 60 MG 24 hr tablet Take 60 mg by mouth 2 (two) times daily.    levothyroxine (SYNTHROID) 125 MCG tablet Take 125 mcg by mouth daily  before breakfast.    metFORMIN (GLUCOPHAGE) 1000 MG tablet Take 1,000 mg by mouth 2 (two) times daily.   metoprolol tartrate (LOPRESSOR) 25 MG tablet Take 25 mg by mouth 2 (two) times daily.   nitroGLYCERIN (NITROSTAT) 0.4 MG SL tablet Place 0.4 mg under the tongue every 5 (five) minutes as needed for chest pain.    omeprazole (PRILOSEC) 20 MG capsule Take 20 mg by mouth every morning. For heart burn   sildenafil (VIAGRA) 100 MG tablet TAKE 1 TABLET BY MOUTH EVERY DAY AS NEEDED   valsartan-hydrochlorothiazide (DIOVAN-HCT) 80-12.5 MG tablet Take 1 tablet by mouth daily.     Allergies:    Patient has no known allergies.   Social History: Social History   Socioeconomic History   Marital status: Single    Spouse name: Not on file   Number of children: 1   Years of education: Not on file   Highest education level: Not on file  Occupational History   Occupation: maintenance  Tobacco Use   Smoking status: Former    Packs/day: .5    Types: Cigarettes   Smokeless tobacco: Never  Vaping Use   Vaping Use: Never used  Substance and Sexual Activity   Alcohol use: Yes    Alcohol/week: 1.0 - 6.0 standard drink of alcohol    Types: 1 - 6 Cans of beer per week   Drug use: Never   Sexual activity: Not on file  Other Topics Concern   Not on file  Social History Narrative   Not on file   Social Determinants of Health   Financial Resource Strain: Not on file  Food Insecurity: Not on file  Transportation Needs: Not on file  Physical Activity: Not on file  Stress: Not on file  Social Connections: Not on file     Family History: The patient's family history includes Heart disease in his father; Kidney cancer in his father. There is no history of Colon cancer, Rectal cancer, Stomach cancer, or Esophageal cancer.  ROS:   All other ROS reviewed and negative. Pertinent positives noted in the HPI.     EKGs/Labs/Other Studies Reviewed:   The following studies were personally reviewed by  me today:  EKG:  EKG is ordered today.  The ekg ordered today demonstrates NSR 36 bpm, nospecific STT changes, and was personally reviewed by me.   Recent Labs: No results found for requested labs within last 365 days.   Recent Lipid Panel    Component Value Date/Time   CHOL 174 08/12/2019 1026   TRIG 332 (H) 08/12/2019 1026   HDL 35 (L) 08/12/2019 1026   CHOLHDL 5.0 08/12/2019 1026   CHOLHDL  4.3 05/03/2010 0503   VLDL 20 05/03/2010 0503   LDLCALC 85 08/12/2019 1026    Physical Exam:   VS:  BP 136/78   Pulse 69   Ht 5\' 10"  (1.778 m)   Wt 190 lb 12.8 oz (86.5 kg)   SpO2 99%   BMI 27.38 kg/m    Wt Readings from Last 3 Encounters:  10/30/22 190 lb 12.8 oz (86.5 kg)  10/31/19 189 lb (85.7 kg)  10/21/19 189 lb (85.7 kg)    General: Well nourished, well developed, in no acute distress Head: Atraumatic, normal size  Eyes: PEERLA, EOMI  Neck: Supple, no JVD Endocrine: No thryomegaly Cardiac: Normal S1, S2; 2 out of 6 systolic ejection murmur Lungs: Clear to auscultation bilaterally, no wheezing, rhonchi or rales  Abd: Soft, nontender, no hepatomegaly  Ext: No edema, pulses 2+ Musculoskeletal: No deformities, BUE and BLE strength normal and equal Skin: Warm and dry, no rashes   Neuro: Alert and oriented to person, place, time, and situation, CNII-XII grossly intact, no focal deficits  Psych: Normal mood and affect   ASSESSMENT:   David Harding is a 62 y.o. male who presents for the following: 1. Coronary artery disease of native artery of native heart with stable angina pectoris ([place])   2. Mixed hyperlipidemia   3. Murmur     PLAN:   1. Coronary artery disease of native artery of native heart with stable angina pectoris ([place]) 2. Mixed hyperlipidemia -He presents with worsening chest discomfort with activity.  Known history of CAD.  Underwent PCI in 2013 to the mid LAD.  Repeat left heart cath in 2020 showed a patent stent.  He had similar symptoms in 2020 but was  found to have no significant blockage.  He was seen in the emergency room in October.  Had negative troponins.  Has continued to have symptoms since that time.  He reports no increase stress.  Symptoms are predictable.  They occur with activity.  He currently on metoprolol tartrate 25 mg twice daily and Imdur 60 mg twice daily.  Given his similar symptoms in 2020 with no significant blockages I would like to start with a stress test.  He also needs an echocardiogram.  Currently on aspirin and Plavix.  Really no room for other medical therapy.  I recommended he remain active.  We should also get him back on Lipitor 80 mg daily.  LDL is or not at goal.  He will work on his diabetes.  He will see [place] back in 6 weeks to discuss further.  If stress test is okay we could continue with medical management versus consideration of repeat left heart catheterization.  I am a bit reluctant to go straight to left heart catheterization given a similar episode in 2020 with normal left heart catheterization.  He was given strict return precautions.  If he develops symptoms at rest that will not resolve with nitroglycerin he should go to the emergency room.  He had a similar presentation in October 2023 but left AGAINST MEDICAL ADVICE as symptoms improved at that time.  3. Murmur -Noticeable murmur on examination.  No echo.  We will set him up for an echocardiogram.  Suggestive of aortic stenosis which is likely mild to moderate.  Shared Decision Making/Informed Consent The risks [chest pain, shortness of breath, cardiac arrhythmias, dizziness, blood pressure fluctuations, myocardial infarction, stroke/transient ischemic attack, nausea, vomiting, allergic reaction, radiation exposure, metallic taste sensation and life-threatening complications (estimated to be 1 in 10,000)],  benefits (risk stratification, diagnosing coronary artery disease, treatment guidance) and alternatives of a nuclear stress test were discussed in detail  with Mr. Gonnering and he agrees to proceed.  Disposition: Return in about 1 year (around 10/30/2023).  Medication Adjustments/Labs and Tests Ordered: Current medicines are reviewed at length with the patient today.  Concerns regarding medicines are outlined above.  Orders Placed This Encounter  Procedures   Cardiac Stress Test: Informed Consent Details: Physician/Practitioner Attestation; Transcribe to consent form and obtain patient signature   MYOCARDIAL PERFUSION IMAGING   EKG 12-Lead   ECHOCARDIOGRAM COMPLETE   Meds ordered this encounter  Medications   atorvastatin (LIPITOR) 80 MG tablet    Sig: Take 1 tablet (80 mg total) by mouth daily.    Dispense:  90 tablet    Refill:  3    Patient Instructions  Medication Instructions:  START Lipitor 80 mg daily   *If you need a refill on your cardiac medications before your next appointment, please call your pharmacy*   Testing/Procedures: Your physician has requested that you have a lexiscan myoview. For further information please visit [email or web address]. Please follow instruction sheet, as given.   Echocardiogram - Your physician has requested that you have an echocardiogram. Echocardiography is a painless test that uses sound waves to create images of your heart. It provides your doctor with information about the size and shape of your heart and how well your heart's chambers and valves are working. This procedure takes approximately one hour. There are no restrictions for this procedure.    Follow-Up: At [facility], you and your health needs are our priority.  As part of our continuing mission to provide you with exceptional heart care, we have created designated Provider Care Teams.  These Care Teams include your primary Cardiologist (physician) and Advanced Practice Providers (APPs -  Physician Assistants and Nurse Practitioners) who all work together to provide you with the care you need, when you need it.  We  recommend signing up for the patient portal called "MyChart".  Sign up information is provided on this After Visit Summary.  MyChart is used to connect with patients for Virtual Visits (Telemedicine).  Patients are able to view lab/test results, encounter notes, upcoming appointments, etc.  Non-urgent messages can be sent to your provider as well.   To learn more about what you can do with MyChart, go to [email or web address].    Your next appointment:   6 week(s)  Provider:   Eleonore Chiquito, MD, Sande Rives, PA-C, Almyra Deforest, PA-C, or Diona Browner, NP         Signed, Addison Naegeli. Audie Box, MD, [place]  [address], [place], [place] [address] [phone]  10/30/2022 8:30 AM

## 2022-10-30 ENCOUNTER — Encounter: Payer: Self-pay | Admitting: Cardiovascular Disease

## 2022-10-30 ENCOUNTER — Ambulatory Visit: Payer: BC Managed Care – PPO | Attending: Cardiovascular Disease | Admitting: Cardiovascular Disease

## 2022-10-30 VITALS — BP 136/78 | HR 69 | Ht 70.0 in | Wt 190.8 lb

## 2022-10-30 DIAGNOSIS — I25118 Atherosclerotic heart disease of native coronary artery with other forms of angina pectoris: Secondary | ICD-10-CM | POA: Diagnosis not present

## 2022-10-30 DIAGNOSIS — E782 Mixed hyperlipidemia: Secondary | ICD-10-CM

## 2022-10-30 DIAGNOSIS — R011 Cardiac murmur, unspecified: Secondary | ICD-10-CM

## 2022-10-30 DIAGNOSIS — I2510 Atherosclerotic heart disease of native coronary artery without angina pectoris: Secondary | ICD-10-CM

## 2022-10-30 MED ORDER — ATORVASTATIN CALCIUM 80 MG PO TABS: 80.0000 mg | ORAL_TABLET | Freq: Every day | ORAL | 3 refills | Status: DC

## 2022-10-30 NOTE — Patient Instructions (Signed)
Medication Instructions:  START Lipitor 80 mg daily   *If you need a refill on your cardiac medications before your next appointment, please call your pharmacy*   Testing/Procedures: Your physician has requested that you have a lexiscan myoview. For further information please visit [email or web address]. Please follow instruction sheet, as given.   Echocardiogram - Your physician has requested that you have an echocardiogram. Echocardiography is a painless test that uses sound waves to create images of your heart. It provides your doctor with information about the size and shape of your heart and how well your heart's chambers and valves are working. This procedure takes approximately one hour. There are no restrictions for this procedure.    Follow-Up: At [facility], you and your health needs are our priority.  As part of our continuing mission to provide you with exceptional heart care, we have created designated Provider Care Teams.  These Care Teams include your primary Cardiologist (physician) and Advanced Practice Providers (APPs -  Physician Assistants and Nurse Practitioners) who all work together to provide you with the care you need, when you need it.  We recommend signing up for the patient portal called "MyChart".  Sign up information is provided on this After Visit Summary.  MyChart is used to connect with patients for Virtual Visits (Telemedicine).  Patients are able to view lab/test results, encounter notes, upcoming appointments, etc.  Non-urgent messages can be sent to your provider as well.   To learn more about what you can do with MyChart, go to [email or web address].    Your next appointment:   6 week(s)  Provider:   Eleonore Chiquito, MD, Sande Rives, PA-C, Almyra Deforest, PA-C, or Diona Browner, NP

## 2022-11-10 ENCOUNTER — Ambulatory Visit: Payer: BC Managed Care – PPO | Admitting: Cardiology

## 2022-11-15 ENCOUNTER — Telehealth (HOSPITAL_COMMUNITY): Payer: Self-pay | Admitting: Cardiovascular Disease

## 2022-11-15 NOTE — Telephone Encounter (Signed)
Patient called and cancelled Myoview and Echocardiogram due to he is having CABG due to cath when he was hospitalized. Orders will be cancelled.

## 2022-12-01 ENCOUNTER — Encounter (HOSPITAL_COMMUNITY): Payer: BC Managed Care – PPO

## 2022-12-01 ENCOUNTER — Other Ambulatory Visit (HOSPITAL_COMMUNITY): Payer: BC Managed Care – PPO

## 2022-12-15 ENCOUNTER — Ambulatory Visit: Payer: BC Managed Care – PPO | Admitting: Cardiovascular Disease

## 2022-12-15 ENCOUNTER — Ambulatory Visit: Payer: BC Managed Care – PPO | Admitting: Cardiology

## 2023-09-20 ENCOUNTER — Other Ambulatory Visit: Payer: Self-pay | Admitting: Cardiovascular Disease
